# Patient Record
Sex: Male | Born: 1975 | Hispanic: No | Marital: Married | Smoking: Current every day smoker
Health system: Southern US, Community
[De-identification: ages and names within clinical notes are randomized; demographics above are authoritative.]

---

## 2017-01-29 ENCOUNTER — Emergency Department (HOSPITAL_COMMUNITY)
Admission: EM | Admit: 2017-01-29 | Discharge: 2017-01-29 | Disposition: A | Discharge status: Home / Self Care | Payer: Medicaid Other | Attending: Emergency Medicine | Admitting: Emergency Medicine

## 2017-01-29 ENCOUNTER — Encounter (HOSPITAL_COMMUNITY): Payer: Self-pay | Admitting: Emergency Medicine

## 2017-01-29 ENCOUNTER — Emergency Department (HOSPITAL_COMMUNITY): Payer: Medicaid Other

## 2017-01-29 DIAGNOSIS — F17200 Nicotine dependence, unspecified, uncomplicated: Secondary | ICD-10-CM | POA: Insufficient documentation

## 2017-01-29 DIAGNOSIS — S46911A Strain of unspecified muscle, fascia and tendon at shoulder and upper arm level, right arm, initial encounter: Secondary | ICD-10-CM | POA: Insufficient documentation

## 2017-01-29 DIAGNOSIS — Y939 Activity, unspecified: Secondary | ICD-10-CM | POA: Diagnosis not present

## 2017-01-29 DIAGNOSIS — X500XXA Overexertion from strenuous movement or load, initial encounter: Secondary | ICD-10-CM | POA: Diagnosis not present

## 2017-01-29 DIAGNOSIS — S4991XA Unspecified injury of right shoulder and upper arm, initial encounter: Secondary | ICD-10-CM | POA: Diagnosis present

## 2017-01-29 DIAGNOSIS — Y999 Unspecified external cause status: Secondary | ICD-10-CM | POA: Insufficient documentation

## 2017-01-29 DIAGNOSIS — Y929 Unspecified place or not applicable: Secondary | ICD-10-CM | POA: Diagnosis not present

## 2017-01-29 MED ORDER — IBUPROFEN 800 MG PO TABS: 800.0000 mg | ORAL_TABLET | Freq: Three times a day (TID) | ORAL | 0 refills | Status: DC

## 2017-01-29 MED ORDER — HYDROCODONE-ACETAMINOPHEN 5-325 MG PO TABS
2.0000 | ORAL_TABLET | Freq: Once | ORAL | Status: AC
  Administered 2017-01-29: 2 via ORAL
  Filled 2017-01-29: qty 2

## 2017-01-29 MED ORDER — CYCLOBENZAPRINE HCL 10 MG PO TABS: 10.0000 mg | ORAL_TABLET | Freq: Two times a day (BID) | ORAL | 0 refills | Status: DC | PRN

## 2017-01-29 NOTE — ED Provider Notes (Signed)
[facility]-EMERGENCY DEPT Provider Note   CSN: [id number] Arrival date & time: 01/29/17  1615     History   Chief Complaint Chief Complaint  Patient presents with  . Shoulder Pain    HPI Ronald Hawkins is a 41 y.o. male.  Patient presents to the emergency department with chief complaint of right shoulder pain. He states that he was lifting a couch yesterday, and felt a "tearing sensation" in his right shoulder. He denies any other injuries. He states that he is still able to lift his arm, but he cannot carry anything or lift anything heavy without severe pain. He states that the pain radiates from his neck to his shoulder and down his arm. 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 He has tried applying heat. His symptoms are worsened with movement and palpation.   The history is provided by the patient. The history is limited by a language barrier. A language interpreter was used.    History reviewed. No pertinent past medical history.  There are no active problems to display for this patient.   History reviewed. No pertinent surgical history.     Home Medications    Prior to Admission medications   Not on File    Family History No family history on file.  Social History Social History  Substance Use Topics  . Smoking status: Current Every Day Smoker  . Smokeless tobacco: Never Used  . Alcohol use No     Allergies   Patient has no known allergies.   Review of Systems Review of Systems  All other systems reviewed and are negative.    Physical Exam Updated Vital Signs BP 103/90 (BP Location: Left Arm)   Pulse 81   Temp 97.9 F (36.6 C) (Oral)   Resp 20   Ht 5\' 5"  (1.651 m)   Wt 73 kg   SpO2 99%   BMI 26.78 kg/m   Physical Exam Nursing note and vitals reviewed.  Constitutional: Pt appears well-developed and well-nourished. No distress.  HENT:  Head: Normocephalic and atraumatic.  Eyes: Conjunctivae are  normal.  Neck: Normal range of motion.  Cardiovascular: Normal rate, regular rhythm. Intact distal pulses.   Capillary refill < 3 sec.  Pulmonary/Chest: Effort normal and breath sounds normal.  Musculoskeletal:  Right Shoulder Pt exhibits tenderness to palpation of the right shoulder diffusely, no bony abnormality or deformity.   ROM: 4/5, limited by pain  Strength: 3/5 limited by pain  Neurological: Pt  is alert. Coordination normal.  Sensation: 5/5 Skin: Skin is warm and dry. Pt is not diaphoretic.  No evidence of open wound or skin tenting Psychiatric: Pt has a normal mood and affect.     ED Treatments / Results  Labs (all labs ordered are listed, but only abnormal results are displayed) Labs Reviewed - No data to display  EKG  EKG Interpretation None       Radiology No results found.  Procedures Procedures (including critical care time)  Medications Ordered in ED Medications  HYDROcodone-acetaminophen (NORCO/VICODIN) 5-325 MG per tablet 2 tablet (not administered)     Initial Impression / Assessment and Plan / ED Course  I have reviewed the triage vital signs and the nursing notes.  Pertinent labs & imaging results that were available during my care of the patient were reviewed by me and considered in my medical decision making (see chart for details).     Patient X-Ray negative for obvious fracture or  dislocation.  Suspect shoulder strain.  Pt advised to follow up with orthopedics. Patient given sling while in ED, conservative therapy recommended and discussed. Patient will be discharged home & is agreeable with above plan. Returns precautions discussed. Pt appears safe for discharge.   Final Clinical Impressions(s) / ED Diagnoses   Final diagnoses:  Strain of right shoulder, initial encounter    New Prescriptions New Prescriptions   CYCLOBENZAPRINE (FLEXERIL) 10 MG TABLET    Take 1 tablet (10 mg total) by mouth 2 (two) times daily as needed for muscle  spasms.   IBUPROFEN (ADVIL,MOTRIN) 800 MG TABLET    Take 1 tablet (800 mg total) by mouth 3 (three) times daily.     Roxy HorsemanRobert Tamikia Chowning, PA-C 01/29/17 1906    Bethann BerkshireJoseph Zammit, MD 01/29/17 2044

## 2017-01-29 NOTE — ED Triage Notes (Signed)
Pt c/o right shoulder pain onset yesterday after doing heavy lifting.

## 2017-01-29 NOTE — ED Notes (Signed)
Declined W/C at D/C and was escorted to lobby by RN. 

## 2017-01-29 NOTE — ED Triage Notes (Signed)
PT received instructions from PA on sling usage  [employer]ia [employer].

## 2018-07-17 ENCOUNTER — Encounter: Payer: Self-pay | Admitting: Physical Therapy

## 2018-07-17 ENCOUNTER — Other Ambulatory Visit: Payer: Self-pay

## 2018-07-17 ENCOUNTER — Ambulatory Visit: Payer: Medicaid Other | Attending: Physical Medicine and Rehabilitation | Admitting: Physical Therapy

## 2018-07-17 DIAGNOSIS — M5442 Lumbago with sciatica, left side: Secondary | ICD-10-CM | POA: Insufficient documentation

## 2018-07-17 DIAGNOSIS — R2689 Other abnormalities of gait and mobility: Secondary | ICD-10-CM | POA: Diagnosis present

## 2018-07-17 DIAGNOSIS — M6281 Muscle weakness (generalized): Secondary | ICD-10-CM | POA: Insufficient documentation

## 2018-07-17 DIAGNOSIS — R29818 Other symptoms and signs involving the nervous system: Secondary | ICD-10-CM | POA: Diagnosis present

## 2018-07-17 NOTE — Patient Instructions (Signed)
TENS UNIT  This is helpful for muscle pain and spasm.   Search and Purchase a TENS 7000 2nd edition at www.tenspros.com or www.amazon.com  (It should be less than $30)     TENS unit instructions:  Do not shower or bathe with the unit on Turn the unit off before removing electrodes or batteries If the electrodes lose stickiness add a drop of water to the electrodes after they are disconnected from the unit and place on plastic sheet. If you continued to have difficulty, call the TENS unit company to purchase more electrodes. Do not apply lotion on the skin area prior to use. Make sure the skin is clean and dry as this will help prolong the life of the electrodes. After use, always check skin for unusual red areas, rash or other skin difficulties. If there are any skin problems, does not apply electrodes to the same area. Never remove the electrodes from the unit by pulling the wires. Do not use the TENS unit or electrodes other than as directed. Do not change electrode placement without consulting your therapist or physician. Keep 2 fingers with between each electrode.   TENS stands for Transcutaneous Electrical Nerve Stimulation. In other words, electrical impulses are allowed to pass through the skin in order to excite a nerve.   Purpose and Use of TENS:  TENS is a method used to manage acute and chronic pain without the use of drugs. It has been effective in managing pain associated with surgery, sprains, strains, trauma, rheumatoid arthritis, and neuralgias. It is a non-addictive, low risk, and non-invasive technique used to control pain. It is not, by any means, a curative form of treatment.   How TENS Works:  Most TENS units are a small pocket-sized unit powered by one 9 volt battery. Attached to the outside of the unit are two lead wires where two pins and/or snaps connect on each wire. All units come with a set of four reusable pads or electrodes. These are placed on the skin  surrounding the area involved. By inserting the leads into  the pads, the electricity can pass from the unit making the circuit complete.  As the intensity is turned up slowly, the electrical current enters the body from the electrodes through the skin to the surrounding nerve fibers. This triggers the release of hormones from within the body. These hormones contain pain relievers. By increasing the circulation of these hormones, the person's pain may be lessened. It is also believed that the electrical stimulation itself helps to block the pain messages being sent to the brain, thus also decreasing the body's perception of pain.   Hazards:  TENS units are NOT to be used by patients with PACEMAKERS, DEFIBRILLATORS, DIABETIC PUMPS, PREGNANT WOMEN, and patients with SEIZURE DISORDERS.  TENS units are NOT to be used over the heart, throat, brain, or spinal cord.  One of the major side effects from the TENS unit may be skin irritation. Some people may develop a rash if they are sensitive to the materials used in the electrodes or the connecting wires.     Avoid overuse due the body getting used to the stem making it not as effective over time.    

## 2018-07-17 NOTE — Therapy (Addendum)
[facility] [address]  [address] [place], [place], [address] Phone: [phone]   Fax:  [phone]  Physical Therapy Evaluation  Patient Details  Name: Ronald Hawkins MRN: [id number] Date of Birth: Oct 15, 1976 Referring Provider: Romero Belling, MD   Encounter Date: 07/17/2018  PT End of Session - 07/17/18 1623    Visit Number  1    Number of Visits  4    Date for PT Re-Evaluation  08/07/18    Authorization Type  Medicaid    PT Start Time  1617    PT Stop Time  1718    PT Time Calculation (min)  61 min    Activity Tolerance  Patient tolerated treatment well;Patient limited by pain    Behavior During Therapy  [facility] for tasks assessed/performed       History reviewed. No pertinent past medical history.  History reviewed. No pertinent surgical history.  There were no vitals filed for this visit.   Subjective Assessment - 07/17/18 1628    Subjective  All information interpreted by pt's son: Pt states that he began having issues with his back in the middle of May with no specific MOI. 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 When trying to bend at work if he goes too far forward he is unable to stand up without first going all the way to the floor on his knees and standing up from there. Pt is currently out of work and hasn't worked in 3 months due to his back pain and does not have an anticipated date to return to work. Pt states that he has a lumbar belt that he normally wears during activity.     Patient is accompained by:  Family member;Interpreter Son serving as Database administrator;Other (comment) Work activities    Currently in Pain?  Yes    Pain Score  5     Pain Location  Back    Pain Orientation  Left    Pain Descriptors / Indicators  Sharp;Shooting;Tingling;Radiating    Pain Type  Acute pain    Pain Onset  More than a month ago    Pain Frequency   Constant    Aggravating Factors   Bending forward    Pain Relieving Factors  Medication, Rest    Effect of Pain on Daily Activities  Unable to perform work activities; pt is not working at this time         [facility] PT Assessment - 07/17/18 0001      Assessment   Medical Diagnosis  L4 Lumbar Redicular Pain    Referring Provider  Romero Belling, MD    Onset Date/Surgical Date  05/02/18    Next MD Visit  08/10/2018    Prior Therapy  No      Balance Screen   Has the patient fallen in the past 6 months  No    Has the patient had a decrease in activity level because of a fear of falling?   No    Is the patient reluctant to leave their home because of a fear of falling?   No      Prior Function   Level of Independence  Independent    Vocation  Full time employment    Garment/textile technologist - bending/lifting      Sensation   Light Touch  Impaired by gross assessment  Additional Comments  With sensation screen pt noted that less sensation felt in L LE in all dermatomal patterns except L3 and L4 with those patterns feeling symmetrical on both sides. Pt states that he experiences a pins and needles feeling in the back of the L leg all the way down into the heel and in the back of the R leg to the knee.       Posture/Postural Control   Posture/Postural Control  Postural limitations    Postural Limitations  Rounded Shoulders;Decreased lumbar lordosis;Posterior pelvic tilt;Flexed trunk      ROM / Strength   AROM / PROM / Strength  Strength;AROM      AROM   AROM Assessment Site  Lumbar    Lumbar Flexion  50% in pain free range    Lumbar Extension  WNL    Lumbar - Right Rotation  WNL    Lumbar - Left Rotation  WNL with pain provocation down the R LE      Strength   Strength Assessment Site  Hip;Knee;Ankle    Right/Left Hip  Right;Left    Right Hip Flexion  4-/5    Right Hip Extension  3+/5    Right Hip ABduction  4-/5    Right Hip ADduction  3-/5    Left Hip Flexion  3+/5     Left Hip Extension  3/5    Left Hip ABduction  3+/5    Left Hip ADduction  3-/5    Right/Left Knee  Right;Left    Right Knee Flexion  4-/5    Right Knee Extension  4+/5    Left Knee Flexion  3+/5    Left Knee Extension  4-/5    Right/Left Ankle  Right;Left    Right Ankle Dorsiflexion  5/5    Left Ankle Dorsiflexion  3/5      Flexibility   Soft Tissue Assessment /Muscle Length  yes    Hamstrings  Mod Hamstring tightness in bil LEs       Special Tests    Special Tests  Lumbar    Lumbar Tests  Slump Test;Straight Leg Raise      Slump test   Findings  Positive    Side  Left      Straight Leg Raise   Findings  Positive    Side   Right    Comment  Positive both R and L                Objective measurements completed on examination: See above findings.      [facility] Adult PT Treatment/Exercise - 07/17/18 0001      Exercises   Exercises  Lumbar      Lumbar Exercises: Stretches   Prone on Elbows Stretch  Other (comment)    Prone on Elbows Stretch Limitations  10 repetitions raising up onto elbows      Modalities   Modalities  Electrical Stimulation;Cryotherapy      Cryotherapy   Number Minutes Cryotherapy  15 Minutes    Cryotherapy Location  Lumbar Spine    Type of Cryotherapy  Ice pack      Electrical Stimulation   Electrical Stimulation Location  15    Electrical Stimulation Action  TENS    Electrical Stimulation Parameters  SD1; intensity to patient tolerance    Electrical Stimulation Goals  Pain             PT Education - 07/17/18 1744    Education Details  Pt  educated on results of examination, POC, HEP, and TENs unit    Person(s) Educated  Patient;Child(ren)    Methods  Explanation;Demonstration;Handout    Comprehension  Verbalized understanding       PT Short Term Goals - 07/17/18 1748      PT SHORT TERM GOAL #1   Title  Pt will be independent with initial HEP    Status  New    Target Date  07/24/18      PT SHORT TERM GOAL #2    Title  Pt will demonstrate global LE strength of 4-/5 to improve ability to tolerate functional and work-related activities    Status  New    Target Date  08/07/18      PT SHORT TERM GOAL #3   Title  Pt will demonstrate 75% of available lumbar ROM without increased pain to improve ability to perform work-related activities and ADLs    Status  New    Target Date  08/07/18      PT SHORT TERM GOAL #4   Title  Pt will report 50% improvement in symptoms from baseline with improvement in ability to improve tolerance to functional activities    Status  New    Target Date  08/07/18        PT Long Term Goals - 07/17/18 1753      PT LONG TERM GOAL #1   Title  Pt will be independent with advanced HEP    Status  New    Target Date  09/18/18      PT LONG TERM GOAL #2   Title  Pt will demonstrate 5/5 LE strength to improve ability to perform work-related activities and ADLs     Status  New    Target Date  09/18/18             Plan - 07/17/18 1745    Clinical Impression Statement  Pt is a 42 y/o M who presents to OP PT with c/c of LBP secondary to 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 Examination reveals decreased lumbar AROM with pain provocation, + SLR test bilaterally, and decreased global hip and knee strength. These impairments are limiting pt's ability to perform work related activities without pain including bending forward to pick up tools. His prognosis is positively impacted by the natural history of the dx, and motivation to return to work. Assessment somewhat limited today secondary to time requirements of interpretation, and formal interpreter will be present for future visits.      Clinical Presentation  Stable    Clinical Decision Making  Low    Rehab Potential  Good    PT Frequency  1x / week    PT Duration  3 weeks    PT Treatment/Interventions  ADLs/Self Care Home Management;Cryotherapy;Iontophoresis 4mg /ml Dexamethasone;Moist Heat;Ultrasound;Gait  training;Stair training;Electrical Stimulation;Functional mobility training;Therapeutic activities;Therapeutic exercise;Balance training;Neuromuscular re-education;Patient/family education;Manual techniques;Passive range of motion;Dry needling;Taping    PT Next Visit Plan  Obtain more activity limitations and participation restrictions    Consulted and Agree with Plan of Care  Patient;Family member/caregiver    Family Member Consulted  Son serving as interpreter       Patient will benefit from skilled therapeutic intervention in order to improve the following deficits and impairments:  Abnormal gait, Hypomobility, Impaired sensation, Decreased activity tolerance, Decreased strength, Pain, Decreased balance, Difficulty walking, Increased muscle spasms, Decreased range of motion, Improper body mechanics, Impaired flexibility, Postural dysfunction  Visit Diagnosis: Acute bilateral low back  pain with left-sided sciatica  Muscle weakness (generalized)  Other symptoms and signs involving the nervous system  Other abnormalities of gait and mobility     Problem List There are no active problems to display for this patient.   Mikal Plane, SPT 07/17/2018, 6:33 PM  [facility] [address]  [address] [place], [place], [address] Phone: [phone]   Fax:  [phone]  Name: Lawton Dollinger MRN: [id number] Date of Birth: 12-Nov-1976

## 2018-07-25 ENCOUNTER — Ambulatory Visit: Payer: Medicaid Other | Attending: Physical Medicine and Rehabilitation

## 2018-07-25 DIAGNOSIS — M6281 Muscle weakness (generalized): Secondary | ICD-10-CM | POA: Insufficient documentation

## 2018-07-25 DIAGNOSIS — R29818 Other symptoms and signs involving the nervous system: Secondary | ICD-10-CM | POA: Insufficient documentation

## 2018-07-25 DIAGNOSIS — R2689 Other abnormalities of gait and mobility: Secondary | ICD-10-CM | POA: Insufficient documentation

## 2018-07-25 DIAGNOSIS — M5442 Lumbago with sciatica, left side: Secondary | ICD-10-CM | POA: Insufficient documentation

## 2018-08-01 ENCOUNTER — Ambulatory Visit: Payer: Medicaid Other

## 2018-08-08 ENCOUNTER — Ambulatory Visit: Payer: Medicaid Other

## 2018-08-08 DIAGNOSIS — M6281 Muscle weakness (generalized): Secondary | ICD-10-CM

## 2018-08-08 DIAGNOSIS — R2689 Other abnormalities of gait and mobility: Secondary | ICD-10-CM | POA: Diagnosis present

## 2018-08-08 DIAGNOSIS — R29818 Other symptoms and signs involving the nervous system: Secondary | ICD-10-CM

## 2018-08-08 DIAGNOSIS — M5442 Lumbago with sciatica, left side: Secondary | ICD-10-CM | POA: Diagnosis present

## 2018-08-08 NOTE — Therapy (Signed)
[facility] [address]  [address] [place], [place], [address] Phone: [phone]   Fax:  [phone]  Physical Therapy Treatment  Patient Details  Name: Lauree ChandlerHusam Marrazzo MRN: [id number] Date of Birth: 08-22-1976 Referring Provider: Romero BellingWesley Ibazebo, MD   Encounter Date: 08/08/2018  PT End of Session - 08/08/18 0811    Visit Number  2    Number of Visits  4    Date for PT Re-Evaluation  --    Authorization Type  Medicaid    Authorization Time Period  8.7.19 - 8.27.19    Authorization - Visit Number  1    Authorization - Number of Visits  3    PT Start Time  0804    PT Stop Time  0900    PT Time Calculation (min)  56 min    Activity Tolerance  Patient tolerated treatment well;Patient limited by pain    Behavior During Therapy  [facility] for tasks assessed/performed       No past medical history on file.  No past surgical history on file.  There were no vitals filed for this visit.  Subjective Assessment - 08/08/18 0812    Subjective  Pt. noting he is still not working and missed last two appointments due to being unable to move from leg pain and having an MRI scheduled.      Patient is accompained by:  Family member;Interpreter   son and interpreter   Currently in Pain?  Yes    Pain Score  6     Pain Location  Back    Pain Orientation  Left    Pain Descriptors / Indicators  Sharp;Shooting   "electric"   Pain Type  Acute pain    Pain Onset  More than a month ago    Pain Frequency  Constant    Aggravating Factors   Bending forward, sleeping on both sides     Pain Relieving Factors  medication, rest, stairs    Multiple Pain Sites  No                       [facility] Adult PT Treatment/Exercise - 08/08/18 0822      Self-Care   Self-Care  Other Self-Care Comments    Other Self-Care Comments   Discussed HEP with heavy use of interpreter to ensure pt. understanding of HEP and increased time required as pt. with  tendancy to converse through instruction and difficulty understanding       Lumbar Exercises: Stretches   Passive Hamstring Stretch  Right;Left;2 reps;30 seconds    Passive Hamstring Stretch Limitations  with strap       Lumbar Exercises: Aerobic   Nustep  Lvl 4, 6 min       Lumbar Exercises: Supine   Bent Knee Raise  5 reps;3 seconds    Bent Knee Raise Limitations  Terminated due to increased LBP    Bridge  10 reps;3 seconds    Bridge Limitations  limited range due to pain increase in upper range     Other Supine Lumbar Exercises  Hooklying adduction ball squeeze 5" x 10 reps       Lumbar Exercises: Prone   Other Prone Lumbar Exercises  POE 2 x 30 sec      Cryotherapy   Number Minutes Cryotherapy  15 Minutes    Cryotherapy Location  Lumbar Spine    Type of Cryotherapy  Ice pack  Glass blower/designerlectrical Stimulation   Electrical Stimulation Location  15    Electrical Stimulation Action  IFC    Electrical Stimulation Parameters  intensity to pt. tolerance, 15'    Electrical Stimulation Goals  Pain             PT Education - 08/08/18 [phone]    Education Details  HEP update: instructions in english as google translate not accurate with translation thus son will interpret instructions     Person(s) Educated  Patient    Methods  Explanation;Demonstration;Verbal cues;Handout    Comprehension  Verbalized understanding;Returned demonstration;Verbal cues required;Need further instruction       PT Short Term Goals - 08/08/18 0816      PT SHORT TERM GOAL #1   Title  Pt will be independent with initial HEP    Status  On-going      PT SHORT TERM GOAL #2   Title  Pt will demonstrate global LE strength of 4-/5 to improve ability to tolerate functional and work-related activities    Status  On-going      PT SHORT TERM GOAL #3   Title  Pt will demonstrate 75% of available lumbar ROM without increased pain to improve ability to perform work-related activities and ADLs    Status  On-going       PT SHORT TERM GOAL #4   Title  Pt will report 50% improvement in symptoms from baseline with improvement in ability to improve tolerance to work-related activities    Status  On-going        PT Long Term Goals - 08/08/18 0816      PT LONG TERM GOAL #1   Title  Pt will be independent with advanced HEP    Status  On-going      PT LONG TERM GOAL #2   Title  Pt will demonstrate 5/5 LE strength to improve ability to perform work-related activities and ADLs     Status  On-going            Plan - 08/08/18 [id number]    Clinical Impression Statement  Pt. noting, via interpreter, he purchased TENS unit on [employer] with some pain relief.  Reports he missed previous therapy appointments due to being, "unable to move due to L LE pain".  Pt. demonstrating increased LBP with all hooklying positioning today and decreased pain with prone positioning.  HEP updated with prone activities today.  Increased time required today to ensure pt. understanding with use of interpreter as pt. with tendency to talk throughout session.  Pt. issued HEP and will plan to monitor response at upcoming visit.  Ended visit with E-stim/ice pack to lumbar spine in prone positioning with good relief following this.     PT Treatment/Interventions  ADLs/Self Care Home Management;Cryotherapy;Iontophoresis 4mg /ml Dexamethasone;Moist Heat;Ultrasound;Gait training;Stair training;Electrical Stimulation;Functional mobility training;Therapeutic activities;Therapeutic exercise;Balance training;Neuromuscular re-education;Patient/family education;Manual techniques;Passive range of motion;Dry needling;Taping    Consulted and Agree with Plan of Care  Patient;Family member/caregiver    Family Member Consulted  son       Patient will benefit from skilled therapeutic intervention in order to improve the following deficits and impairments:  Abnormal gait, Hypomobility, Impaired sensation, Decreased activity tolerance, Decreased strength, Pain,  Decreased balance, Difficulty walking, Increased muscle spasms, Decreased range of motion, Improper body mechanics, Impaired flexibility, Postural dysfunction  Visit Diagnosis: Acute bilateral low back pain with left-sided sciatica  Muscle weakness (generalized)  Other symptoms and signs involving the nervous system  Other abnormalities of gait and mobility  Problem List There are no active problems to display for this patient.   Kermit Balo, PTA 08/08/18 12:25 PM  [facility] [address]  [address] [place], [place], [address] Phone: [phone]   Fax:  [phone]  Name: Zyden Suman MRN: [id number] Date of Birth: 07/11/1976

## 2018-08-14 ENCOUNTER — Encounter: Payer: Self-pay | Admitting: Physical Therapy

## 2018-08-14 ENCOUNTER — Ambulatory Visit: Payer: Medicaid Other | Admitting: Physical Therapy

## 2018-08-14 DIAGNOSIS — M6281 Muscle weakness (generalized): Secondary | ICD-10-CM

## 2018-08-14 DIAGNOSIS — R2689 Other abnormalities of gait and mobility: Secondary | ICD-10-CM

## 2018-08-14 DIAGNOSIS — M5442 Lumbago with sciatica, left side: Secondary | ICD-10-CM | POA: Diagnosis not present

## 2018-08-14 DIAGNOSIS — R29818 Other symptoms and signs involving the nervous system: Secondary | ICD-10-CM

## 2018-08-14 NOTE — Patient Instructions (Signed)

## 2018-08-14 NOTE — Therapy (Addendum)
[facility] [address]  [place], [place], [address] Phone: [phone]   Fax:  [phone]  Physical Therapy Treatment  Patient Details  Name: Ronald Hawkins MRN: [id number] Date of Birth: 03-06-1976 Referring Provider: Laroy Apple, MD   Encounter Date: 08/14/2018  PT End of Session - 08/14/18 0846    Visit Number  3    Number of Visits  4    Authorization Type  Medicaid    Authorization Time Period  07/25/18 - 08/14/18    Authorization - Visit Number  2    Authorization - Number of Visits  3    PT Start Time  0846    PT Stop Time  0934    PT Time Calculation (min)  48 min    Activity Tolerance  Patient tolerated treatment well;Patient limited by pain    Behavior During Therapy  [facility] for tasks assessed/performed       History reviewed. No pertinent past medical history.  History reviewed. No pertinent surgical history.  There were no vitals filed for this visit.  Subjective Assessment - 08/14/18 0849    Subjective  Pt reports when he takes the medication at night, in the morning he feels heaviness and has difficulty walking.    Patient is accompained by:  Interpreter    Limitations  Sitting;Standing;Lifting    How long can you sit comfortably?  1-2 hrs    How long can you stand comfortably?  10-15 min    How long can you walk comfortably?  15-20 min    Currently in Pain?  Yes    Pain Score  6     Pain Location  Back    Pain Orientation  Lower    Pain Descriptors / Indicators  Heaviness;Tightness    Pain Type  Acute pain    Pain Radiating Towards  intermittent numbness in B LE down to feet    Pain Onset  More than a month ago    Pain Frequency  Constant    Aggravating Factors   sitting, bending forward    Pain Relieving Factors  medicatiom, tramadol    Effect of Pain on Daily Activities  remains out of work due to pain         [facility] PT Assessment - 08/14/18 0846      Assessment   Medical  Diagnosis  L4 Lumbar Radicular Pain    Referring Provider  Laroy Apple, MD    Next MD Visit  08/24/18      AROM   Lumbar Flexion  50% in pain free range; hands to ankles with pain    Lumbar Extension  WNL    Lumbar - Right Rotation  WNL    Lumbar - Left Rotation  WNL with pain provocation down the R LE      Strength   Right Hip Flexion  4+/5    Right Hip Extension  4/5    Right Hip ABduction  4/5    Right Hip ADduction  4-/5    Left Hip Flexion  4/5    Left Hip Extension  3+/5    Left Hip ABduction  4-/5    Left Hip ADduction  3+/5    Right Knee Flexion  4+/5    Right Knee Extension  4+/5    Left Knee Flexion  4/5    Left Knee Extension  4-/5    Right Ankle Dorsiflexion  4+/5    Left  Ankle Dorsiflexion  4-/5                   [facility] Adult PT Treatment/Exercise - 08/14/18 0846      Exercises   Exercises  Lumbar      Lumbar Exercises: Stretches   Prone on Elbows Stretch  60 seconds;3 reps    Press Ups  5 reps;5 seconds   2 sets   Quadruped Mid Back Stretch  30 seconds    Quadruped Mid Back Stretch Limitations  seated prayer stretch with green Pball (unable to tolerate quadruped posiiton) - no increased pain during stretch, but reports increased pain upon return to upright sitting      Lumbar Exercises: Aerobic   Recumbent Bike  L2 x 6 min      Lumbar Exercises: Standing   Functional Squats  10 reps;3 seconds    Functional Squats Limitations  repeated verbal & tactile cues to acheive proper technique - performed best when chair placed as target for hips behind pt     Row  Both;10 reps;Theraband;Strengthening    Theraband Level (Row)  Level 2 (Red)    Row Limitations  cues for abdominal bracing & scapular activation, avoiding excessive shoulder rotation    Shoulder Extension  Both;10 reps;Theraband;Strengthening    Theraband Level (Shoulder Extension)  Level 2 (Red)    Shoulder Extension Limitations  cues for abdominal bracing & scapular activation       Lumbar Exercises: Quadruped   Other Quadruped Lumbar Exercises  pt unable to tolerate quadruped position             PT Education - 08/14/18 0943    Education Details  Provided education in proper posture and body mechanics for positioning and common daily tasks at home    Person(s) Educated  Patient    Methods  Explanation;Demonstration    Comprehension  Verbalized understanding       PT Short Term Goals - 08/14/18 0925      PT SHORT TERM GOAL #1   Title  Pt will be independent with initial HEP    Status  Achieved      PT SHORT TERM GOAL #2   Title  Pt will demonstrate global LE strength of 4-/5 to improve ability to tolerate functional and work-related activities    Status  Partially Met      PT SHORT TERM GOAL #3   Title  Pt will demonstrate 75% of available lumbar ROM without increased pain to improve ability to perform work-related activities and ADLs    Status  Not Met      PT SHORT TERM GOAL #4   Title  Pt will report 50% improvement in symptoms from baseline with improvement in ability to improve tolerance to work-related activities    Status  Not Met        PT Long Term Goals - 08/08/18 0816      PT LONG TERM GOAL #1   Title  Pt will be independent with advanced HEP    Status  On-going      PT LONG TERM GOAL #2   Title  Pt will demonstrate 5/5 LE strength to improve ability to perform work-related activities and ADLs     Status  On-going            Plan - 08/14/18 0954    Clinical Impression Statement  Ronald Hawkins continues to report significant LBP with LE radiculopathy limiting positional and activity tolerance with most activities.  He missed his first 2 scheduled f/u visits following the eval due to being "unable to move due to L LE pain" and although notes some relief of pain with prone positioning and certain exercises, he does not feel he has had a significant change in his pain with the 2 treatment visits he has completed over the past 2 weeks.  Lumbar ROM essentially unchanged, but slight improvement noted with LE strength testing. Pt's initial Medicaid authorization expiring today and pt would prefer to wait and follow up with MD on 08/24/18 rather than have PT submit for further Medicaid visits, therefore will place pt on hold for 30 days in the event that the pt or the MD would like him to continue with PT.    PT Treatment/Interventions  ADLs/Self Care Home Management;Cryotherapy;Iontophoresis '4mg'$ /ml Dexamethasone;Moist Heat;Ultrasound;Gait training;Stair training;Electrical Stimulation;Functional mobility training;Therapeutic activities;Therapeutic exercise;Balance training;Neuromuscular re-education;Patient/family education;Manual techniques;Passive range of motion;Dry needling;Taping    PT Next Visit Plan  30 day hold    Consulted and Agree with Plan of Care  Patient       Patient will benefit from skilled therapeutic intervention in order to improve the following deficits and impairments:  Abnormal gait, Hypomobility, Impaired sensation, Decreased activity tolerance, Decreased strength, Pain, Decreased balance, Difficulty walking, Increased muscle spasms, Decreased range of motion, Improper body mechanics, Impaired flexibility, Postural dysfunction  Visit Diagnosis: Acute bilateral low back pain with left-sided sciatica  Muscle weakness (generalized)  Other symptoms and signs involving the nervous system  Other abnormalities of gait and mobility     Problem List There are no active problems to display for this patient.   Percival Spanish, PT, MPT 08/14/2018, 10:07 AM  [facility] [address]  [place], [place], [address] Phone: [phone]   Fax:  [phone]  Name: Ronald Hawkins MRN: [id number] Date of Birth: 1976-07-01  PHYSICAL THERAPY DISCHARGE SUMMARY  Visits from Start of Care: 3  Current functional level related to goals / functional outcomes:    Refer to above clinical impression for status as of last visit on 08/14/18. Pt was placed on hold for 30 days and did not return to PT, therefore will proceed with discharge from PT for this episode.   Remaining deficits:   As above.   Education / Equipment:   HEP, Training and development officer education Plan: Patient agrees to discharge.  Patient goals were not met. Patient is being discharged due to not returning since the last visit.  ?????   Percival Spanish, PT, MPT 10/18/18, 9:14 AM  [facility] [place], [place], [address] Phone: [phone]   Fax:  [phone]

## 2019-06-01 ENCOUNTER — Encounter (HOSPITAL_COMMUNITY): Payer: Self-pay | Admitting: Emergency Medicine

## 2019-06-01 ENCOUNTER — Emergency Department (HOSPITAL_COMMUNITY): Payer: Medicaid Other

## 2019-06-01 ENCOUNTER — Other Ambulatory Visit: Payer: Self-pay

## 2019-06-01 ENCOUNTER — Emergency Department (HOSPITAL_COMMUNITY)
Admission: EM | Admit: 2019-06-01 | Discharge: 2019-06-01 | Disposition: A | Discharge status: Home / Self Care | Payer: Medicaid Other | Attending: Emergency Medicine | Admitting: Emergency Medicine

## 2019-06-01 DIAGNOSIS — Y9389 Activity, other specified: Secondary | ICD-10-CM | POA: Diagnosis not present

## 2019-06-01 DIAGNOSIS — S0990XA Unspecified injury of head, initial encounter: Secondary | ICD-10-CM

## 2019-06-01 DIAGNOSIS — Y9259 Other trade areas as the place of occurrence of the external cause: Secondary | ICD-10-CM | POA: Diagnosis not present

## 2019-06-01 DIAGNOSIS — S0083XA Contusion of other part of head, initial encounter: Secondary | ICD-10-CM | POA: Diagnosis not present

## 2019-06-01 DIAGNOSIS — S6991XA Unspecified injury of right wrist, hand and finger(s), initial encounter: Secondary | ICD-10-CM | POA: Insufficient documentation

## 2019-06-01 DIAGNOSIS — S339XXA Sprain of unspecified parts of lumbar spine and pelvis, initial encounter: Secondary | ICD-10-CM | POA: Insufficient documentation

## 2019-06-01 DIAGNOSIS — Y998 Other external cause status: Secondary | ICD-10-CM | POA: Insufficient documentation

## 2019-06-01 DIAGNOSIS — F17210 Nicotine dependence, cigarettes, uncomplicated: Secondary | ICD-10-CM | POA: Diagnosis not present

## 2019-06-01 DIAGNOSIS — S335XXA Sprain of ligaments of lumbar spine, initial encounter: Secondary | ICD-10-CM

## 2019-06-01 MED ORDER — ACETAMINOPHEN 500 MG PO TABS
1000.0000 mg | ORAL_TABLET | Freq: Once | ORAL | Status: AC
  Administered 2019-06-01: 1000 mg via ORAL
  Filled 2019-06-01: qty 2

## 2019-06-01 MED ORDER — IBUPROFEN 600 MG PO TABS: 600.0000 mg | ORAL_TABLET | Freq: Four times a day (QID) | ORAL | 0 refills | Status: DC | PRN

## 2019-06-01 NOTE — Discharge Instructions (Signed)
Take motrin for pain   Expect to be stiff and sore and you will be black and blue   See your doctor  Return to ER if you have worse headaches, loss of consciousness, vomiting
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
## 2019-09-26 ENCOUNTER — Other Ambulatory Visit: Payer: Self-pay | Admitting: Neurosurgery

## 2019-09-26 DIAGNOSIS — M5416 Radiculopathy, lumbar region: Secondary | ICD-10-CM

## 2019-10-13 ENCOUNTER — Other Ambulatory Visit: Payer: Self-pay

## 2019-10-13 ENCOUNTER — Ambulatory Visit
Admission: RE | Admit: 2019-10-13 | Discharge: 2019-10-13 | Disposition: A | Discharge status: Home / Self Care | Payer: Medicaid Other | Source: Ambulatory Visit | Attending: Neurosurgery | Admitting: Neurosurgery

## 2019-10-13 DIAGNOSIS — M5416 Radiculopathy, lumbar region: Secondary | ICD-10-CM

## 2019-10-24 ENCOUNTER — Other Ambulatory Visit: Payer: Self-pay

## 2019-10-24 ENCOUNTER — Encounter (HOSPITAL_BASED_OUTPATIENT_CLINIC_OR_DEPARTMENT_OTHER): Payer: Self-pay | Admitting: Emergency Medicine

## 2019-10-24 ENCOUNTER — Emergency Department (HOSPITAL_BASED_OUTPATIENT_CLINIC_OR_DEPARTMENT_OTHER)
Admission: EM | Admit: 2019-10-24 | Discharge: 2019-10-24 | Disposition: A | Discharge status: Home / Self Care | Payer: Medicaid Other | Attending: Emergency Medicine | Admitting: Emergency Medicine

## 2019-10-24 DIAGNOSIS — R509 Fever, unspecified: Secondary | ICD-10-CM | POA: Insufficient documentation

## 2019-10-24 DIAGNOSIS — F17210 Nicotine dependence, cigarettes, uncomplicated: Secondary | ICD-10-CM | POA: Diagnosis not present

## 2019-10-24 DIAGNOSIS — R6883 Chills (without fever): Secondary | ICD-10-CM | POA: Diagnosis present

## 2019-10-24 DIAGNOSIS — Z20828 Contact with and (suspected) exposure to other viral communicable diseases: Secondary | ICD-10-CM | POA: Insufficient documentation

## 2019-10-24 DIAGNOSIS — Z20822 Contact with and (suspected) exposure to covid-19: Secondary | ICD-10-CM

## 2019-10-24 LAB — CBC WITH DIFFERENTIAL/PLATELET
Abs Immature Granulocytes: 0.08 10*3/uL — ABNORMAL HIGH (ref 0.00–0.07)
Basophils Absolute: 0.1 10*3/uL (ref 0.0–0.1)
Basophils Relative: 1 %
Eosinophils Absolute: 0.5 10*3/uL (ref 0.0–0.5)
Eosinophils Relative: 3 %
HCT: 46.3 % (ref 39.0–52.0)
Hemoglobin: 15.7 g/dL (ref 13.0–17.0)
Immature Granulocytes: 1 %
Lymphocytes Relative: 21 %
Lymphs Abs: 3.2 10*3/uL (ref 0.7–4.0)
MCH: 30 pg (ref 26.0–34.0)
MCHC: 33.9 g/dL (ref 30.0–36.0)
MCV: 88.5 fL (ref 80.0–100.0)
Monocytes Absolute: 1 10*3/uL (ref 0.1–1.0)
Monocytes Relative: 7 %
Neutro Abs: 10.4 10*3/uL — ABNORMAL HIGH (ref 1.7–7.7)
Neutrophils Relative %: 67 %
Platelets: 256 10*3/uL (ref 150–400)
RBC: 5.23 MIL/uL (ref 4.22–5.81)
RDW: 11.6 % (ref 11.5–15.5)
WBC: 15.3 10*3/uL — ABNORMAL HIGH (ref 4.0–10.5)
nRBC: 0 % (ref 0.0–0.2)

## 2019-10-24 LAB — URINALYSIS, ROUTINE W REFLEX MICROSCOPIC
Bilirubin Urine: NEGATIVE
Glucose, UA: NEGATIVE mg/dL
Hgb urine dipstick: NEGATIVE
Ketones, ur: NEGATIVE mg/dL
Leukocytes,Ua: NEGATIVE
Nitrite: NEGATIVE
Protein, ur: NEGATIVE mg/dL
Specific Gravity, Urine: 1.01 (ref 1.005–1.030)
pH: 6 (ref 5.0–8.0)

## 2019-10-24 LAB — BASIC METABOLIC PANEL
Anion gap: 10 (ref 5–15)
BUN: 9 mg/dL (ref 6–20)
CO2: 20 mmol/L — ABNORMAL LOW (ref 22–32)
Calcium: 9.1 mg/dL (ref 8.9–10.3)
Chloride: 103 mmol/L (ref 98–111)
Creatinine, Ser: 0.76 mg/dL (ref 0.61–1.24)
GFR calc Af Amer: >60 mL/min (ref >60)
GFR calc non Af Amer: >60 mL/min (ref >60)
Glucose, Bld: 166 mg/dL — ABNORMAL HIGH (ref 70–99)
Potassium: 3.4 mmol/L — ABNORMAL LOW (ref 3.5–5.1)
Sodium: 133 mmol/L — ABNORMAL LOW (ref 135–145)

## 2019-10-24 LAB — CK: Total CK: 86 U/L (ref 49–397)

## 2019-10-24 LAB — SARS CORONAVIRUS 2 (TAT 6-24 HRS): SARS Coronavirus 2: NEGATIVE

## 2019-10-24 MED ORDER — KETOROLAC TROMETHAMINE 15 MG/ML IJ SOLN
15.0000 mg | Freq: Once | INTRAMUSCULAR | Status: AC
  Administered 2019-10-24: 15 mg via INTRAVENOUS
  Filled 2019-10-24: qty 1

## 2019-10-24 MED ORDER — NAPROXEN 500 MG PO TABS: ORAL_TABLET | ORAL | 0 refills | Status: AC

## 2019-10-24 MED ORDER — SODIUM CHLORIDE 0.9 % IV BOLUS
1000.0000 mL | Freq: Once | INTRAVENOUS | Status: AC
  Administered 2019-10-24: 02:00:00 1000 mL via INTRAVENOUS

## 2019-10-24 NOTE — ED Triage Notes (Addendum)
Pt co body aches with chills and subjective fever since yesterday. Denies cough, chest pain, vomiting, sore throat.

## 2019-10-24 NOTE — ED Provider Notes (Signed)
[place] [facility] Provider Note: Georgena Spurling, MD, FACEP  CSN: [id number] MRN: [id number] ARRIVAL: 10/24/19 at [place]: [place] PRESENT ILLNESS  10/24/19 1:51 AM Leelan Rizzi is a 43 y.o. male with body aches, chills and subjective fever since yesterday.  He has had some mild nasal congestion but denies cough, shortness of breath, chest pain, nausea, vomiting, diarrhea or sore throat.  He has had no known COVID-19 exposure.  He rates his body aches as an 8 out of 10.  These are present in his muscles as well as joints, worse with movement or palpation.  He has taken an over-the-counter analgesic with partial relief.   History reviewed. No pertinent past medical history.  History reviewed. No pertinent surgical history.  No family history on file.  Social History   Tobacco Use  . Smoking status: Current Every Day Smoker  . Smokeless tobacco: Never Used  Substance Use Topics  . Alcohol use: No  . Drug use: Never    Prior to Admission medications   Medication Sig Start Date End Date Taking? Authorizing Provider  naproxen (NAPROSYN) 500 MG tablet Take 1 tablet twice daily as needed for fever or body aches. 10/24/19   Eladio Dentremont, Jenny Reichmann, MD    Allergies Patient has no known allergies.   REVIEW OF SYSTEMS  Negative except as noted here or in the History of Present Illness.   PHYSICAL EXAMINATION  Initial Vital Signs Blood pressure (!) 141/83, pulse (!) 112, temperature 98.3 F (36.8 C), temperature source Oral, resp. rate 16, SpO2 96 %.  Examination General: Well-developed, well-nourished male in no acute distress; appearance consistent with age of record HENT: normocephalic; atraumatic; no pharyngeal erythema or exudate Eyes: pupils equal, round and reactive to light; extraocular muscles intact Neck: supple Heart: regular rate and rhythm; tachycardia Lungs: clear to auscultation bilaterally Abdomen: soft; nondistended;  nontender; bowel sounds present Extremities: No deformity; generalized muscle tenderness; pulses normal Neurologic: Awake, alert and oriented; motor function intact in all extremities and symmetric; no facial droop Skin: Warm and dry Psychiatric: Normal mood and affect   RESULTS  Summary of this visit's results, reviewed and interpreted by myself:   EKG Interpretation  Date/Time:    Ventricular Rate:    PR Interval:    QRS Duration:   QT Interval:    QTC Calculation:   R Axis:     Text Interpretation:        Laboratory Studies: Results for orders placed or performed during the hospital encounter of 10/24/19 (from the past 24 hour(s))  CBC with Differential     Status: Abnormal   Collection Time: 10/24/19  2:06 AM  Result Value Ref Range   WBC 15.3 (H) 4.0 - 10.5 K/uL   RBC 5.23 4.22 - 5.81 MIL/uL   Hemoglobin 15.7 13.0 - 17.0 g/dL   HCT 46.3 39.0 - 52.0 %   MCV 88.5 80.0 - 100.0 fL   MCH 30.0 26.0 - 34.0 pg   MCHC 33.9 30.0 - 36.0 g/dL   RDW 11.6 11.5 - 15.5 %   Platelets 256 150 - 400 K/uL   nRBC 0.0 0.0 - 0.2 %   Neutrophils Relative % 67 %   Neutro Abs 10.4 (H) 1.7 - 7.7 K/uL   Lymphocytes Relative 21 %   Lymphs Abs 3.2 0.7 - 4.0 K/uL   Monocytes Relative 7 %   Monocytes Absolute 1.0 0.1 - 1.0 K/uL   Eosinophils  Relative 3 %   Eosinophils Absolute 0.5 0.0 - 0.5 K/uL   Basophils Relative 1 %   Basophils Absolute 0.1 0.0 - 0.1 K/uL   Immature Granulocytes 1 %   Abs Immature Granulocytes 0.08 (H) 0.00 - 0.07 K/uL  Basic metabolic panel     Status: Abnormal   Collection Time: 10/24/19  2:06 AM  Result Value Ref Range   Sodium 133 (L) 135 - 145 mmol/L   Potassium 3.4 (L) 3.5 - 5.1 mmol/L   Chloride 103 98 - 111 mmol/L   CO2 20 (L) 22 - 32 mmol/L   Glucose, Bld 166 (H) 70 - 99 mg/dL   BUN 9 6 - 20 mg/dL   Creatinine, Ser 0.98 0.61 - 1.24 mg/dL   Calcium 9.1 8.9 - 11.9 mg/dL   GFR calc non Af Amer >60 >60 mL/min   GFR calc Af Amer >60 >60 mL/min   Anion gap  10 5 - 15  CK     Status: None   Collection Time: 10/24/19  2:06 AM  Result Value Ref Range   Total CK 86 49 - 397 U/L  Urinalysis, Routine w reflex microscopic     Status: None   Collection Time: 10/24/19  3:30 AM  Result Value Ref Range   Color, Urine YELLOW YELLOW   APPearance CLEAR CLEAR   Specific Gravity, Urine 1.010 1.005 - 1.030   pH 6.0 5.0 - 8.0   Glucose, UA NEGATIVE NEGATIVE mg/dL   Hgb urine dipstick NEGATIVE NEGATIVE   Bilirubin Urine NEGATIVE NEGATIVE   Ketones, ur NEGATIVE NEGATIVE mg/dL   Protein, ur NEGATIVE NEGATIVE mg/dL   Nitrite NEGATIVE NEGATIVE   Leukocytes,Ua NEGATIVE NEGATIVE   Imaging Studies: No results found.  ED COURSE and MDM  Nursing notes, initial and subsequent vitals signs, including pulse oximetry, reviewed and interpreted by myself.  Vitals:   10/24/19 0149 10/24/19 0430  BP: (!) 141/83 (!) 146/68  Pulse: (!) 112 78  Resp: 16 16  Temp: 98.3 F (36.8 C) 98.4 F (36.9 C)  TempSrc: Oral Oral  SpO2: 96% 98%   Medications  sodium chloride 0.9 % bolus 1,000 mL ( Intravenous Stopped 10/24/19 0323)  ketorolac (TORADOL) 15 MG/ML injection 15 mg (15 mg Intravenous Given 10/24/19 0219)   Buryl Keysor was evaluated in Emergency Department on 10/24/2019 for the symptoms described in the history of present illness. He was evaluated in the context of the global COVID-19 pandemic, which necessitated consideration that the patient might be at risk for infection with the SARS-CoV-2 virus that causes COVID-19. Institutional protocols and algorithms that pertain to the evaluation of patients at risk for COVID-19 are in a state of rapid change based on information released by regulatory bodies including the CDC and federal and state organizations. These policies and algorithms were followed during the patient's care in the ED.  Patient symptoms are concerning for COVID-19 as some patients have only body aches as a major symptom.  We will have him self isolate  and treat with NSAIDs for body aches and fever.  He should return for difficulty breathing.  PROCEDURES  Procedures   ED DIAGNOSES     ICD-10-CM   1. Suspected COVID-19 virus infection  Z20.828        Breshae Belcher, Jonny Ruiz, MD 10/24/19 [phone]

## 2020-01-27 ENCOUNTER — Other Ambulatory Visit: Payer: Self-pay

## 2020-01-27 ENCOUNTER — Emergency Department (HOSPITAL_BASED_OUTPATIENT_CLINIC_OR_DEPARTMENT_OTHER)
Admission: EM | Admit: 2020-01-27 | Discharge: 2020-01-27 | Disposition: A | Discharge status: Home / Self Care | Payer: Medicaid Other | Attending: Emergency Medicine | Admitting: Emergency Medicine

## 2020-01-27 ENCOUNTER — Encounter (HOSPITAL_BASED_OUTPATIENT_CLINIC_OR_DEPARTMENT_OTHER): Payer: Self-pay

## 2020-01-27 DIAGNOSIS — R0602 Shortness of breath: Secondary | ICD-10-CM | POA: Insufficient documentation

## 2020-01-27 DIAGNOSIS — J40 Bronchitis, not specified as acute or chronic: Secondary | ICD-10-CM | POA: Insufficient documentation

## 2020-01-27 DIAGNOSIS — R0981 Nasal congestion: Secondary | ICD-10-CM | POA: Diagnosis present

## 2020-01-27 DIAGNOSIS — Z20822 Contact with and (suspected) exposure to covid-19: Secondary | ICD-10-CM | POA: Insufficient documentation

## 2020-01-27 DIAGNOSIS — Z79899 Other long term (current) drug therapy: Secondary | ICD-10-CM | POA: Diagnosis not present

## 2020-01-27 DIAGNOSIS — F17210 Nicotine dependence, cigarettes, uncomplicated: Secondary | ICD-10-CM | POA: Insufficient documentation

## 2020-01-27 MED ORDER — ALBUTEROL SULFATE HFA 108 (90 BASE) MCG/ACT IN AERS
2.0000 | INHALATION_SPRAY | Freq: Once | RESPIRATORY_TRACT | Status: AC
  Administered 2020-01-27: 2 via RESPIRATORY_TRACT
  Filled 2020-01-27: qty 6.7

## 2020-01-27 NOTE — ED Provider Notes (Signed)
[facility] EMERGENCY DEPARTMENT Provider Note   CSN: [id number] Arrival date & time: 01/27/20  1018     History Chief Complaint  Patient presents with  . Nasal Congestion    Ronald Hawkins is a 44 y.o. male who presents with runny nose, shortness of breath, cough.  Patient states for the past 3 days he has had multiple URI symptoms including a fever, sore throat, intermittent runny nose.  He is also had cough and shortness of breath.  He denies headache, inability to smell or taste, chest pain, abdominal pain, nausea, vomiting, diarrhea.  He is here with his 2 young children who have similar symptoms.  He states everyone in the household has had symptoms like this.  He has been tested for Covid in the past but not recently.  He is a smoker.  He states that he had a fever couple days ago but has not had recurrent fever.  HPI     History reviewed. No pertinent past medical history.  There are no problems to display for this patient.   History reviewed. No pertinent surgical history.     No family history on file.  Social History   Tobacco Use  . Smoking status: Current Every Day Smoker  . Smokeless tobacco: Never Used  Substance Use Topics  . Alcohol use: No  . Drug use: Never    Home Medications Prior to Admission medications   Medication Sig Start Date End Date Taking? Authorizing Provider  atorvastatin (LIPITOR) 40 MG tablet Take 40 mg by mouth at bedtime. 09/26/19   [provider]  naproxen (NAPROSYN) 500 MG tablet Take 1 tablet twice daily as needed for fever or body aches. 10/24/19   Molpus, John, MD    Allergies    Other  Review of Systems   Review of Systems  Constitutional: Positive for fatigue and fever.  HENT: Positive for rhinorrhea and sore throat. Negative for ear pain.   Respiratory: Positive for cough, shortness of breath and wheezing.   Cardiovascular: Negative for chest pain.  Gastrointestinal: Negative for abdominal pain,  diarrhea, nausea and vomiting.    Physical Exam Updated Vital Signs BP 121/84 (BP Location: Left Arm)   Pulse 79   Temp 98.4 F (36.9 C) (Oral)   Resp 18   Ht 5\' 5"  (1.651 m)   Wt 91.4 kg   SpO2 99%   BMI 33.53 kg/m   Physical Exam Vitals and nursing note reviewed.  Constitutional:      General: He is not in acute distress.    Appearance: Normal appearance. He is well-developed. He is not ill-appearing.  HENT:     Head: Normocephalic and atraumatic.     Right Ear: Tympanic membrane normal.     Left Ear: Tympanic membrane normal.     Nose: Nose normal.     Mouth/Throat:     Mouth: Mucous membranes are moist.  Eyes:     General: No scleral icterus.       Right eye: No discharge.        Left eye: No discharge.     Conjunctiva/sclera: Conjunctivae normal.     Pupils: Pupils are equal, round, and reactive to light.  Cardiovascular:     Rate and Rhythm: Normal rate and regular rhythm.  Pulmonary:     Effort: Pulmonary effort is normal. No respiratory distress.     Breath sounds: Wheezing (Diffuse expiratory wheezes) present.  Abdominal:     General: There is no distension.  Musculoskeletal:     Cervical back: Normal range of motion.  Skin:    General: Skin is warm and dry.  Neurological:     Mental Status: He is alert and oriented to person, place, and time.  Psychiatric:        Behavior: Behavior normal.     ED Results / Procedures / Treatments   Labs (all labs ordered are listed, but only abnormal results are displayed) Labs Reviewed  NOVEL CORONAVIRUS, NAA (HOSP ORDER, SEND-OUT TO REF LAB; TAT 18-24 HRS)    EKG None  Radiology No results found.  Procedures Procedures (including critical care time)  Medications Ordered in ED Medications  albuterol (VENTOLIN HFA) 108 (90 Base) MCG/ACT inhaler 2 puff (2 puffs Inhalation Given 01/27/20 1237)    ED Course  I have reviewed the triage vital signs and the nursing notes.  Pertinent labs & imaging results  that were available during my care of the patient were reviewed by me and considered in my medical decision making (see chart for details).  43 year old male presents with symptoms consistent with viral bronchitis.  His vital signs are normal here.  HEENT exam is unremarkable.  On his lung exam he is wheezing.  He is a smoker.  We will treat for bronchitis with an inhaler.  Do not feel he needs imaging or prednisone today since wheezing is minimal and vitals are reassuring.  He is requesting Covid testing as well which was sent out.  [name] was evaluated in Emergency Department on 01/27/2020 for the symptoms described in the history of present illness. He was evaluated in the context of the global COVID-19 pandemic, which necessitated consideration that the patient might be at risk for infection with the SARS-CoV-2 virus that causes COVID-19. Institutional protocols and algorithms that pertain to the evaluation of patients at risk for COVID-19 are in a state of rapid change based on information released by regulatory bodies including the CDC and federal and state organizations. These policies and algorithms were followed during the patient's care in the ED.   MDM Rules/Calculators/A&P                       Final Clinical Impression(s) / ED Diagnoses Final diagnoses:  Bronchitis    Rx / DC Orders ED Discharge Orders    None       Bethel Born, PA-C 01/27/20 1338    Little, Ambrose Finland, MD 01/27/20 [phone]

## 2020-01-27 NOTE — ED Triage Notes (Signed)
Pt arrives ambulatory to ED with reports of cold symptoms X5 days - some SOB, sore throat, tiredness, cough, and runny nose. Pt denies any loss of taste or smell, diarrhea,

## 2020-01-27 NOTE — Discharge Instructions (Signed)
Use inhaler as needed for shortness of breath and wheezing and avoid smoking Please quarantine until you get the results of your COVID test which should take 2-3 days Take Tylenol or Ibuprofen for fever or pain as needed Please follow up with your doctor

## 2020-01-29 LAB — NOVEL CORONAVIRUS, NAA (HOSP ORDER, SEND-OUT TO REF LAB; TAT 18-24 HRS): SARS-CoV-2, NAA: NOT DETECTED

## 2020-03-17 ENCOUNTER — Emergency Department (HOSPITAL_BASED_OUTPATIENT_CLINIC_OR_DEPARTMENT_OTHER)
Admission: EM | Admit: 2020-03-17 | Discharge: 2020-03-17 | Disposition: A | Discharge status: Home / Self Care | Payer: Medicaid Other | Attending: Emergency Medicine | Admitting: Emergency Medicine

## 2020-03-17 ENCOUNTER — Emergency Department (HOSPITAL_BASED_OUTPATIENT_CLINIC_OR_DEPARTMENT_OTHER): Payer: Medicaid Other

## 2020-03-17 ENCOUNTER — Other Ambulatory Visit: Payer: Self-pay

## 2020-03-17 ENCOUNTER — Encounter (HOSPITAL_BASED_OUTPATIENT_CLINIC_OR_DEPARTMENT_OTHER): Payer: Self-pay | Admitting: Emergency Medicine

## 2020-03-17 DIAGNOSIS — F17210 Nicotine dependence, cigarettes, uncomplicated: Secondary | ICD-10-CM | POA: Diagnosis not present

## 2020-03-17 DIAGNOSIS — R062 Wheezing: Secondary | ICD-10-CM | POA: Diagnosis not present

## 2020-03-17 DIAGNOSIS — Z20822 Contact with and (suspected) exposure to covid-19: Secondary | ICD-10-CM | POA: Diagnosis not present

## 2020-03-17 DIAGNOSIS — R05 Cough: Secondary | ICD-10-CM | POA: Insufficient documentation

## 2020-03-17 DIAGNOSIS — M7918 Myalgia, other site: Secondary | ICD-10-CM | POA: Diagnosis not present

## 2020-03-17 MED ORDER — ALBUTEROL SULFATE HFA 108 (90 BASE) MCG/ACT IN AERS
2.0000 | INHALATION_SPRAY | Freq: Once | RESPIRATORY_TRACT | Status: AC
  Administered 2020-03-17: 2 via RESPIRATORY_TRACT
  Filled 2020-03-17: qty 6.7

## 2020-03-17 NOTE — ED Triage Notes (Signed)
Triaged with interpreter for Arabic. PT c/o body aches, cough and near friend yesterday who had covid.

## 2020-03-17 NOTE — ED Provider Notes (Signed)
Emergency Department Provider Note   I have reviewed the triage vital signs and the nursing notes.   HISTORY  Chief Complaint Cough and Generalized Body Aches  Encounter performed entirely with arabic video interpreter.  HPI Ronald Hawkins is a 44 y.o. male presents to the ED with cough, wheezing, and body aches in the setting of recent COVID exposure. He has had close contact with an individual who has tested positive for COVID recently. He denies CP or SOB. No fever. No chills. No diarrhea or vomiting. Patient does smoke cigarettes. No radiation of symptoms or modifying factors.   History reviewed. No pertinent past medical history.  There are no problems to display for this patient.   History reviewed. No pertinent surgical history.  Allergies Other  No family history on file.  Social History Social History   Tobacco Use  . Smoking status: Current Every Day Smoker  . Smokeless tobacco: Never Used  Substance Use Topics  . Alcohol use: No  . Drug use: Never    Review of Systems  Constitutional: No fever/chills. Positive body aches.  Eyes: No visual changes. ENT: No sore throat. Cardiovascular: Denies chest pain. Respiratory: Denies shortness of breath. Positive cough.  Gastrointestinal: No abdominal pain.  No nausea, no vomiting.  No diarrhea.  No constipation. Genitourinary: Negative for dysuria. Musculoskeletal: Negative for back pain. Skin: Negative for rash. Neurological: Negative for headaches, focal weakness or numbness.  10-point ROS otherwise negative.  ____________________________________________   PHYSICAL EXAM:  VITAL SIGNS: ED Triage Vitals  Enc Vitals Group     BP 03/17/20 2217 130/90     Pulse Rate 03/17/20 2217 78     Resp 03/17/20 2217 18     Temp 03/17/20 2217 98 F (36.7 C)     Temp Source 03/17/20 2217 Oral     SpO2 03/17/20 2217 99 %     Weight 03/17/20 2217 197 lb (89.4 kg)     Height 03/17/20 2217 5\' 7"  (1.702 m)    Constitutional: Alert and oriented. Well appearing and in no acute distress. Eyes: Conjunctivae are normal. Head: Atraumatic. Nose: Mild congestion/rhinnorhea. Mouth/Throat: Mucous membranes are moist.  Oropharynx non-erythematous. Neck: No stridor.   Cardiovascular: Normal rate, regular rhythm. Good peripheral circulation. Grossly normal heart sounds.   Respiratory: Normal respiratory effort.  No retractions. Lungs with faint wheezing. Good air movement.  Gastrointestinal: Soft and nontender. No distention.  Musculoskeletal: No gross deformities of extremities. Neurologic:  Normal speech and language.  Skin:  Skin is warm, dry and intact. No rash noted.  ____________________________________________   LABS (all labs ordered are listed, but only abnormal results are displayed)  Labs Reviewed  SARS CORONAVIRUS 2 (TAT 6-24 HRS)   ____________________________________________  RADIOLOGY  DG Chest Portable 1 View  Result Date: 03/17/2020 CLINICAL DATA:  Cough EXAM: PORTABLE CHEST 1 VIEW COMPARISON:  11/04/2019 FINDINGS: Linear atelectasis or scar at the left base. No consolidation or effusion. Normal heart size. No pneumothorax IMPRESSION: No active disease.  Scarring or atelectasis at the left base Electronically Signed   By: Donavan Foil M.D.   On: 03/17/2020 23:13    ____________________________________________   PROCEDURES  Procedure(s) performed:   Procedures  None ____________________________________________   INITIAL IMPRESSION / ASSESSMENT AND PLAN / ED COURSE  Pertinent labs & imaging results that were available during my care of the patient were reviewed by me and considered in my medical decision making (see chart for details).   Patient presents to the ED with  COVID-like symptoms. No increased WOB. Mild wheezing with smoking history. No hypoxemia. CXR reviewed and clear. Plan for COVID testing and quarantine until test is back. Discussed ED return  precautions. Discussed following results in the MyChart app.    ____________________________________________  FINAL CLINICAL IMPRESSION(S) / ED DIAGNOSES  Final diagnoses:  Suspected COVID-19 virus infection     MEDICATIONS GIVEN DURING THIS VISIT:  Medications  albuterol (VENTOLIN HFA) 108 (90 Base) MCG/ACT inhaler 2 puff (2 puffs Inhalation Given 03/17/20 2336)    Note:  This document was prepared using Dragon voice recognition software and may include unintentional dictation errors.  Alona Bene, MD, [facility]    Aviah Sorci, Arlyss Repress, MD 03/18/20 [phone]

## 2020-03-17 NOTE — Discharge Instructions (Signed)
You were seen in the emergency department today with COVID-19-like symptoms.  I am testing you for Covid and you can follow the test results in MyChart.  Please remain isolated from others for at least the next 10 days.  Return to the emergency department with any new or worsening symptoms such as chest pain, trouble breathing, confusion.

## 2020-03-18 LAB — SARS CORONAVIRUS 2 (TAT 6-24 HRS): SARS Coronavirus 2: NEGATIVE

## 2020-03-26 ENCOUNTER — Encounter (HOSPITAL_BASED_OUTPATIENT_CLINIC_OR_DEPARTMENT_OTHER): Payer: Self-pay | Admitting: *Deleted

## 2020-03-26 ENCOUNTER — Emergency Department (HOSPITAL_BASED_OUTPATIENT_CLINIC_OR_DEPARTMENT_OTHER)
Admission: EM | Admit: 2020-03-26 | Discharge: 2020-03-26 | Disposition: A | Discharge status: Home / Self Care | Payer: Medicaid Other | Attending: Emergency Medicine | Admitting: Emergency Medicine

## 2020-03-26 ENCOUNTER — Emergency Department (HOSPITAL_BASED_OUTPATIENT_CLINIC_OR_DEPARTMENT_OTHER): Payer: Medicaid Other

## 2020-03-26 ENCOUNTER — Other Ambulatory Visit: Payer: Self-pay

## 2020-03-26 DIAGNOSIS — U071 COVID-19: Secondary | ICD-10-CM | POA: Diagnosis not present

## 2020-03-26 DIAGNOSIS — R05 Cough: Secondary | ICD-10-CM

## 2020-03-26 DIAGNOSIS — F17200 Nicotine dependence, unspecified, uncomplicated: Secondary | ICD-10-CM | POA: Diagnosis not present

## 2020-03-26 DIAGNOSIS — Z79899 Other long term (current) drug therapy: Secondary | ICD-10-CM | POA: Insufficient documentation

## 2020-03-26 DIAGNOSIS — B349 Viral infection, unspecified: Secondary | ICD-10-CM

## 2020-03-26 DIAGNOSIS — Z20822 Contact with and (suspected) exposure to covid-19: Secondary | ICD-10-CM

## 2020-03-26 DIAGNOSIS — R059 Cough, unspecified: Secondary | ICD-10-CM

## 2020-03-26 LAB — SARS CORONAVIRUS 2 AG (30 MIN TAT): SARS Coronavirus 2 Ag: NEGATIVE

## 2020-03-26 LAB — SARS CORONAVIRUS 2 (TAT 6-24 HRS): SARS Coronavirus 2: POSITIVE — AB

## 2020-03-26 MED ORDER — BENZONATATE 100 MG PO CAPS: 100.0000 mg | ORAL_CAPSULE | Freq: Three times a day (TID) | ORAL | 0 refills | Status: AC

## 2020-03-26 MED FILL — BENZONATATE 100 MG CAPS: 100 | 7 days supply | Qty: 21 | Fill #0

## 2020-03-26 NOTE — ED Provider Notes (Signed)
[facility] EMERGENCY DEPARTMENT Provider Note   CSN: [id number] Arrival date & time: 03/26/20  1500     History Chief Complaint  Patient presents with  . Cough    Ronald Hawkins is a 44 y.o. male who presents to the ED today with complaints of gradual onset, constant, dry cough x 2 days. Pt also endorses shortness of breath, headache, subjective fevers, chills, loss of taste and smell x 2 days. Pt endorses that his son has been diagnosed with COVID 19.  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  He states that he is concerned that he could also have Covid given his new symptoms he would like to be checked. Pt was seen in the ED on 03/30 after COVID exposure however tested negative for COVID at that time. He was prescribed an albuterol inhaler which he has been using with mild relief. Pt denies chest pain, hemoptysis, abdominal pain, diarrhea, or any other associated symptoms.   The history is provided by the patient and medical records. The history is limited by a language barrier. A language interpreter was used.       History reviewed. No pertinent past medical history.  There are no problems to display for this patient.   History reviewed. No pertinent surgical history.     No family history on file.  Social History   Tobacco Use  . Smoking status: Current Every Day Smoker  . Smokeless tobacco: Never Used  Substance Use Topics  . Alcohol use: No  . Drug use: Never    Home Medications Prior to Admission medications   Medication Sig Start Date End Date Taking? Authorizing Provider  atorvastatin (LIPITOR) 40 MG tablet Take 40 mg by mouth at bedtime. 09/26/19   [provider]  benzonatate (TESSALON) 100 MG capsule Take 1 capsule (100 mg total) by mouth every 8 (eight) hours. 03/26/20   Hyman Hopes, Teige Rountree,  PA-C  naproxen (NAPROSYN) 500 MG tablet Take 1 tablet twice daily as needed for fever or body aches. 10/24/19   Molpus, John, MD    Allergies    Other  Review of Systems   Review of Systems  Constitutional: Positive for chills, fatigue and fever.  Respiratory: Positive for cough and shortness of breath.   Cardiovascular: Negative for chest pain.  Gastrointestinal: Negative for diarrhea.  Musculoskeletal: Positive for myalgias.    Physical Exam Updated Vital Signs BP (!) 141/97   Pulse 97   Resp 20   Ht 5\' 7"  (1.702 m)   Wt 89 kg   SpO2 98%   BMI 30.73 kg/m   Physical Exam Vitals and nursing note reviewed.  Constitutional:      Appearance: He is not ill-appearing or diaphoretic.  HENT:     Head: Normocephalic and atraumatic.  Eyes:     Conjunctiva/sclera: Conjunctivae normal.  Cardiovascular:     Rate and Rhythm: Normal rate and regular rhythm.     Pulses: Normal pulses.  Pulmonary:     Effort: Pulmonary effort is normal.     Breath sounds: Normal breath sounds. No wheezing, rhonchi or rales.  Skin:    General: Skin is warm and dry.     Coloration: Skin is not jaundiced.  Neurological:     Mental Status: He is alert.     ED Results /  Procedures / Treatments   Labs (all labs ordered are listed, but only abnormal results are displayed) Labs Reviewed  SARS CORONAVIRUS 2 (TAT 6-24 HRS) - Abnormal; Notable for the following components:      Result Value   SARS Coronavirus 2 POSITIVE (*)    All other components within normal limits  SARS CORONAVIRUS 2 AG (30 MIN TAT)    EKG EKG Interpretation  Date/Time:  Thursday March 26 2020 15:50:43 EDT Ventricular Rate:  79 PR Interval:    QRS Duration: 113 QT Interval:  373 QTC Calculation: 428 R Axis:   88 Text Interpretation: Sinus rhythm Borderline intraventricular conduction delay No prior ecg for comparison Confirmed by Veryl Speak [id number] on 03/26/2020 4:17:30 PM   Radiology DG Chest Port 1 View  Result  Date: 03/26/2020 CLINICAL DATA:  Cough and shortness of breath for COVID-19 exposure and symptomatology EXAM: PORTABLE CHEST 1 VIEW COMPARISON:  03/17/2020 FINDINGS: Cardiac shadow is within normal limits. The lungs are well aerated bilaterally. No focal infiltrate or sizable effusion is seen. No bony abnormality is noted. IMPRESSION: No active disease. Electronically Signed   By: Inez Catalina M.D.   On: 03/26/2020 16:32    Procedures Procedures (including critical care time)  Medications Ordered in ED Medications - No data to display  ED Course  I have reviewed the triage vital signs and the nursing notes.  Pertinent labs & imaging results that were available during my care of the patient were reviewed by me and considered in my medical decision making (see chart for details).  Clinical Course as of Mar 26 2336  Thu Mar 26, 2020  1613 amb to room from triage room air  0 2 sat 97%   [MV]  1623 SARS Coronavirus 2 Ag: NEGATIVE [MV]    Clinical Course User Index [MV] Eustaquio Maize, PA-C   MDM Rules/Calculators/A&P                      44 year old Arabic speaking male who presents to the ED today complaining of cough, body aches, fever, loss of taste and smell x2 days.  He has been taking care of his son at home who is Covid positive.  Patient is requesting to be tested as his other children and wife have been quarantining in a separate part of the house and he wants to make sure that he could not likely spread it to them. On arrival to the ED he is nontachycardic and nontachypneic. He does endorse SOB however able to speak in full sentences without difficulty. Satting 98% on RA. He was recently seen in the ED 1 week ago after COVID exposure however tested negative - he did not start having symptoms until 2 days ago. Will reswab. Discussed rapid test vs send out; pt is eager to know right away and therefore POC test has been ordered with plan for PCR if negative. Will obtain CXR, EKG given SOB  and have pt ambulate in room with pulse ox to ensure he does not desaturate.   Pt ambulated with pulse ox > 97% on RA.  CXR negative.  POC COVID negative; will order PCR test.   Pt discharged home at this time with instructions to self isolate until he receives his results. Have discussed that even if he is negative this may be a false negative given he has been in close contact with his son who is positive and is having covid like symptoms. Will discharge with tessalon perles  for symptomatic relief. Strict return precautions discussed. Pt in agreement with plan and stable for discharge home.   This note was prepared using Dragon voice recognition software and may include unintentional dictation errors due to the inherent limitations of voice recognition software.  Jaloni Zimny was evaluated in Emergency Department on 03/26/2020 for the symptoms described in the history of present illness. He was evaluated in the context of the global COVID-19 pandemic, which necessitated consideration that the patient might be at risk for infection with the SARS-CoV-2 virus that causes COVID-19. Institutional protocols and algorithms that pertain to the evaluation of patients at risk for COVID-19 are in a state of rapid change based on information released by regulatory bodies including the CDC and federal and state organizations. These policies and algorithms were followed during the patient's care in the ED.  Final Clinical Impression(s) / ED Diagnoses Final diagnoses:  Cough  Viral illness  Person under investigation for COVID-19    Rx / DC Orders ED Discharge Orders         Ordered    benzonatate (TESSALON) 100 MG capsule  Every 8 hours     03/26/20 1636           Discharge Instructions     Please stay at home and self isolate until you hear about your test results. We will call you in 2-3 days time IF you test positive. You can also log into your mychart to check the results that way. Given you  have been around your son who is positive it is very likely that you also have COVID 19 today.   Continue using your albuterol inhaler as needed. I have prescribed cough medication to take as well. Continue monitoring your fevers and take Tylenol as needed if your fever is greater than 100.4 F  Follow up with your PCP. If you do not have one you can follow up with [facility] for your primary care needs  Return to the ED IMMEDIATELY for any worsening symptoms including worsening shortness of breath, rapid breathing, chest pain.        Tanda Rockers, PA-C 03/26/20 0160    Geoffery Lyons, MD 03/26/20 [phone]

## 2020-03-26 NOTE — ED Triage Notes (Signed)
Pt c/o cough , loss of smell and taste and fever x 3 days, son at home with covid

## 2020-03-26 NOTE — Discharge Instructions (Signed)
Please stay at home and self isolate until you hear about your test results. We will call you in 2-3 days time IF you test positive. You can also log into your mychart to check the results that way. Given you have been around your son who is positive it is very likely that you also have COVID 19 today.   Continue using your albuterol inhaler as needed. I have prescribed cough medication to take as well. Continue monitoring your fevers and take Tylenol as needed if your fever is greater than 100.4 F  Follow up with your PCP. If you do not have one you can follow up with [facility] and [facility] for your primary care needs  Return to the ED IMMEDIATELY for any worsening symptoms including worsening shortness of breath, rapid breathing, chest pain.

## 2020-03-26 NOTE — ED Notes (Signed)
amb to room from triage room air  0 2 sat 97%

## 2020-04-02 ENCOUNTER — Other Ambulatory Visit: Payer: Self-pay

## 2020-04-02 ENCOUNTER — Encounter (HOSPITAL_BASED_OUTPATIENT_CLINIC_OR_DEPARTMENT_OTHER): Payer: Self-pay | Admitting: *Deleted

## 2020-04-02 ENCOUNTER — Emergency Department (HOSPITAL_BASED_OUTPATIENT_CLINIC_OR_DEPARTMENT_OTHER)
Admission: EM | Admit: 2020-04-02 | Discharge: 2020-04-03 | Disposition: A | Discharge status: Home / Self Care | Payer: Medicaid Other | Attending: Emergency Medicine | Admitting: Emergency Medicine

## 2020-04-02 DIAGNOSIS — M7918 Myalgia, other site: Secondary | ICD-10-CM | POA: Insufficient documentation

## 2020-04-02 DIAGNOSIS — U071 COVID-19: Secondary | ICD-10-CM | POA: Diagnosis not present

## 2020-04-02 DIAGNOSIS — R509 Fever, unspecified: Secondary | ICD-10-CM | POA: Insufficient documentation

## 2020-04-02 DIAGNOSIS — F17200 Nicotine dependence, unspecified, uncomplicated: Secondary | ICD-10-CM | POA: Diagnosis not present

## 2020-04-02 NOTE — ED Provider Notes (Addendum)
[facility] Provider Note   CSN: [id number] Arrival date & time: 04/02/20  2051     History Chief Complaint  Patient presents with  . Covid Positive    Ronald Hawkins is a 44 y.o. male.  Patient is a 44 year old male with no significant past medical history.  He presents today for evaluation of body aches.  He had a positive Covid test 1 week ago.  He denies any shortness of breath, chest pain.  He does report fevers and chills.  He is here with 2 family members who are ill in a similar fashion.  The history is provided by the patient.       History reviewed. No pertinent past medical history.  There are no problems to display for this patient.   History reviewed. No pertinent surgical history.     No family history on file.  Social History   Tobacco Use  . Smoking status: Current Every Day Smoker  . Smokeless tobacco: Never Used  Substance Use Topics  . Alcohol use: No  . Drug use: Never    Home Medications Prior to Admission medications   Medication Sig Start Date End Date Taking? Authorizing Provider  atorvastatin (LIPITOR) 40 MG tablet Take 40 mg by mouth at bedtime. 09/26/19   [provider]  benzonatate (TESSALON) 100 MG capsule Take 1 capsule (100 mg total) by mouth every 8 (eight) hours. 03/26/20   Alroy Bailiff, Margaux, PA-C  naproxen (NAPROSYN) 500 MG tablet Take 1 tablet twice daily as needed for fever or body aches. 10/24/19   Molpus, Jenny Reichmann, MD    Allergies    Other  Review of Systems   Review of Systems  All other systems reviewed and are negative.   Physical Exam Updated Vital Signs BP 122/89   Pulse 97   Temp 98.1 F (36.7 C) (Oral)   Resp 20   Ht 5\' 7"  (1.702 m)   Wt 89 kg   SpO2 98%   BMI 30.73 kg/m   Physical Exam Vitals and nursing note reviewed.  Constitutional:      General: He is not in acute distress.    Appearance: He is well-developed. He is not diaphoretic.  HENT:     Head:  Normocephalic and atraumatic.  Cardiovascular:     Rate and Rhythm: Normal rate and regular rhythm.     Heart sounds: No murmur. No friction rub.  Pulmonary:     Effort: Pulmonary effort is normal. No respiratory distress.     Breath sounds: Normal breath sounds. No wheezing or rales.  Abdominal:     General: Bowel sounds are normal. There is no distension.     Palpations: Abdomen is soft.     Tenderness: There is no abdominal tenderness.  Musculoskeletal:        General: Normal range of motion.     Cervical back: Normal range of motion and neck supple.  Skin:    General: Skin is warm and dry.  Neurological:     Mental Status: He is alert and oriented to person, place, and time.     Coordination: Coordination normal.     ED Results / Procedures / Treatments   Labs (all labs ordered are listed, but only abnormal results are displayed) Labs Reviewed - No data to display  EKG None  Radiology No results found.  Procedures Procedures (including critical care time)  Medications Ordered in ED Medications - No data to display  ED Course  I have reviewed the triage vital signs and the nursing notes.  Pertinent labs & imaging results that were available during my care of the patient were reviewed by me and considered in my medical decision making (see chart for details).    MDM Rules/Calculators/A&P  Patient presenting with body aches and positive Covid test 1 week ago.  Patient is not hypoxic and vital signs are stable.  I see no indication for admission.  He is otherwise healthy and is well-appearing.  Patient to be discharged with increased fluid intake, Tylenol, and ibuprofen as needed.  Deloyd Ballard was evaluated in Emergency Department on 04/02/2020 for the symptoms described in the history of present illness. He was evaluated in the context of the global COVID-19 pandemic, which necessitated consideration that the patient might be at risk for infection with the  SARS-CoV-2 virus that causes COVID-19. Institutional protocols and algorithms that pertain to the evaluation of patients at risk for COVID-19 are in a state of rapid change based on information released by regulatory bodies including the CDC and federal and state organizations. These policies and algorithms were followed during the patient's care in the ED.  Final Clinical Impression(s) / ED Diagnoses Final diagnoses:  None    Rx / DC Orders ED Discharge Orders    None       Geoffery Lyons, MD 04/02/20 1740    Geoffery Lyons, MD 04/02/20 [phone]

## 2020-04-02 NOTE — Discharge Instructions (Addendum)
Drink plenty of fluids and get plenty of rest.  Tylenol 1000 mg rotated with ibuprofen 600 mg every 4 hours as needed for pain or fever.  Return to the emergency department if you develop difficulty breathing, severe chest pain, or other new and concerning symptoms.  Isolate at home until your symptoms are gone +1-week.      Person Under Monitoring Name: Ronald Hawkins  Location: [address] [place] [address]   Infection Prevention Recommendations for Individuals Confirmed to have, or Being Evaluated for, 2019 Novel Coronavirus (COVID-19) Infection Who Receive Care at Home  Individuals who are confirmed to have, or are being evaluated for, COVID-19 should follow the prevention steps below until a healthcare provider or local or state health department says they can return to normal activities.  Stay home except to get medical care You should restrict activities outside your home, except for getting medical care. Do not go to work, school, or public areas, and do not use public transportation or taxis.  Call ahead before visiting your doctor Before your medical appointment, call the healthcare provider and tell them that you have, or are being evaluated for, COVID-19 infection. This will help the healthcare provider's office take steps to keep other people from getting infected. Ask your healthcare provider to call the local or state health department.  Monitor your symptoms Seek prompt medical attention if your illness is worsening (e.g., difficulty breathing). Before going to your medical appointment, call the healthcare provider and tell them that you have, or are being evaluated for, COVID-19 infection. Ask your healthcare provider to call the local or state health department.  Wear a facemask You should wear a facemask that covers your nose and mouth when you are in the same room with other people and when you visit a healthcare provider. People who live with or  visit you should also wear a facemask while they are in the same room with you.  Separate yourself from other people in your home As much as possible, you should stay in a different room from other people in your home. Also, you should use a separate bathroom, if available.  Avoid sharing household items You should not share dishes, drinking glasses, cups, eating utensils, towels, bedding, or other items with other people in your home. After using these items, you should wash them thoroughly with soap and water.  Cover your coughs and sneezes Cover your mouth and nose with a tissue when you cough or sneeze, or you can cough or sneeze into your sleeve. Throw used tissues in a lined trash can, and immediately wash your hands with soap and water for at least 20 seconds or use an alcohol-based hand rub.  Wash your [employer] your hands often and thoroughly with soap and water for at least 20 seconds. You can use an alcohol-based hand sanitizer if soap and water are not available and if your hands are not visibly dirty. Avoid touching your eyes, nose, and mouth with unwashed hands.   Prevention Steps for Caregivers and Household Members of Individuals Confirmed to have, or Being Evaluated for, COVID-19 Infection Being Cared for in the Home  If you live with, or provide care at home for, a person confirmed to have, or being evaluated for, COVID-19 infection please follow these guidelines to prevent infection:  Follow healthcare provider's instructions Make sure that you understand and can help the patient follow any healthcare provider instructions for all care.  Provide for the patient's basic  needs You should help the patient with basic needs in the home and provide support for getting groceries, prescriptions, and other personal needs.  Monitor the patient's symptoms If they are getting sicker, call his or her medical provider and tell them that the patient has, or is being evaluated  for, COVID-19 infection. This will help the healthcare provider's office take steps to keep other people from getting infected. Ask the healthcare provider to call the local or state health department.  Limit the number of people who have contact with the patient If possible, have only one caregiver for the patient. Other household members should stay in another home or place of residence. If this is not possible, they should stay in another room, or be separated from the patient as much as possible. Use a separate bathroom, if available. Restrict visitors who do not have an essential need to be in the home.  Keep older adults, very young children, and other sick people away from the patient Keep older adults, very young children, and those who have compromised immune systems or chronic health conditions away from the patient. This includes people with chronic heart, lung, or kidney conditions, diabetes, and cancer.  Ensure good ventilation Make sure that shared spaces in the home have good air flow, such as from an air conditioner or an opened window, weather permitting.  Wash your hands often Wash your hands often and thoroughly with soap and water for at least 20 seconds. You can use an alcohol based hand sanitizer if soap and water are not available and if your hands are not visibly dirty. Avoid touching your eyes, nose, and mouth with unwashed hands. Use disposable paper towels to dry your hands. If not available, use dedicated cloth towels and replace them when they become wet.  Wear a facemask and gloves Wear a disposable facemask at all times in the room and gloves when you touch or have contact with the patient's blood, body fluids, and/or secretions or excretions, such as sweat, saliva, sputum, nasal mucus, vomit, urine, or feces.  Ensure the mask fits over your nose and mouth tightly, and do not touch it during use. Throw out disposable facemasks and gloves after using them. Do not  reuse. Wash your hands immediately after removing your facemask and gloves. If your personal clothing becomes contaminated, carefully remove clothing and launder. Wash your hands after handling contaminated clothing. Place all used disposable facemasks, gloves, and other waste in a lined container before disposing them with other household waste. Remove gloves and wash your hands immediately after handling these items.  Do not share dishes, glasses, or other household items with the patient Avoid sharing household items. You should not share dishes, drinking glasses, cups, eating utensils, towels, bedding, or other items with a patient who is confirmed to have, or being evaluated for, COVID-19 infection. After the person uses these items, you should wash them thoroughly with soap and water.  Wash laundry thoroughly Immediately remove and wash clothes or bedding that have blood, body fluids, and/or secretions or excretions, such as sweat, saliva, sputum, nasal mucus, vomit, urine, or feces, on them. Wear gloves when handling laundry from the patient. Read and follow directions on labels of laundry or clothing items and detergent. In general, wash and dry with the warmest temperatures recommended on the label.  Clean all areas the individual has used often Clean all touchable surfaces, such as counters, tabletops, doorknobs, bathroom fixtures, toilets, phones, keyboards, tablets, and bedside tables, every day.  Also, clean any surfaces that may have blood, body fluids, and/or secretions or excretions on them. Wear gloves when cleaning surfaces the patient has come in contact with. Use a diluted bleach solution (e.g., dilute bleach with 1 part bleach and 10 parts water) or a household disinfectant with a label that says EPA-registered for coronaviruses. To make a bleach solution at home, add 1 tablespoon of bleach to 1 quart (4 cups) of water. For a larger supply, add  cup of bleach to 1 gallon (16  cups) of water. Read labels of cleaning products and follow recommendations provided on product labels. Labels contain instructions for safe and effective use of the cleaning product including precautions you should take when applying the product, such as wearing gloves or eye protection and making sure you have good ventilation during use of the product. Remove gloves and wash hands immediately after cleaning.  Monitor yourself for signs and symptoms of illness Caregivers and household members are considered close contacts, should monitor their health, and will be asked to limit movement outside of the home to the extent possible. Follow the monitoring steps for close contacts listed on the symptom monitoring form.   ? If you have additional questions, contact your local health department or call the epidemiologist on call at [phone] (available 24/7). ? This guidance is subject to change. For the most up-to-date guidance from [facility], please refer to their website: [email or web address]

## 2020-04-02 NOTE — ED Triage Notes (Signed)
States he was diagnosed with Covid a week ago. Here with head and abdominal pain. He has been taking Ibuprofen for pain and fever since diagnosis.

## 2021-10-01 IMAGING — MR MR LUMBAR SPINE W/O CM
4 of 5 series · 25 of 48 positions shown · non-contrast
Comparison: Lumbar spine radiograph 06/01/2019

CLINICAL DATA: Two year history of low back pain.

EXAM:
MRI LUMBAR SPINE WITHOUT CONTRAST
TECHNIQUE: Multiplanar, multisequence MR imaging of the lumbar spine was
performed. No intravenous contrast was administered.

[Series 4: T1 · sagittal · 4.0mm · 0.55mm/px · 5 of 13 slices shown (1 of 2)]
[im 1/13]
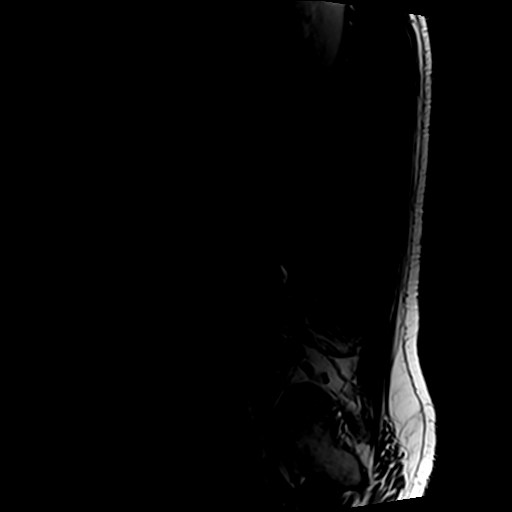
[im 4/13]
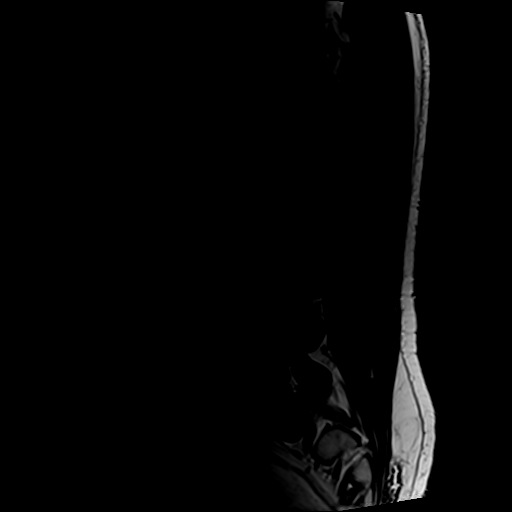
[im 7/13]
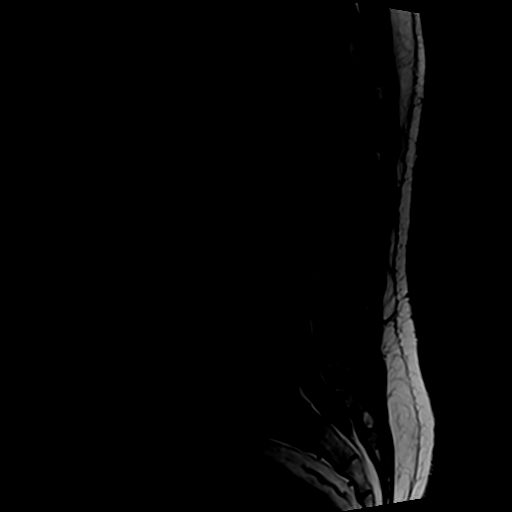
[im 10/13]
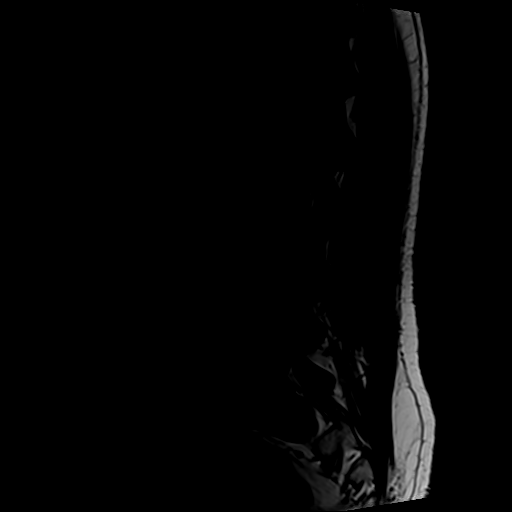
[im 13/13]
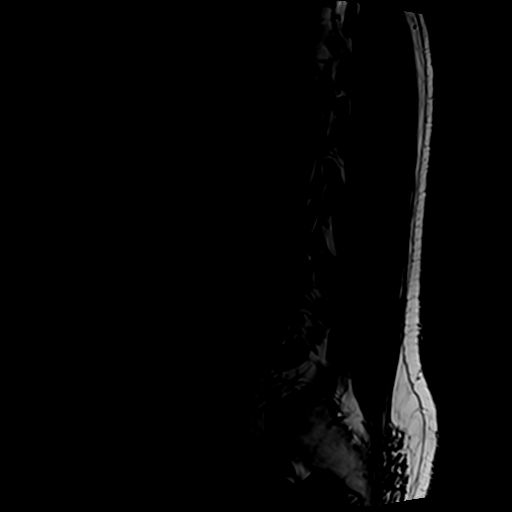

[Series 5: T2 post-contrast · sagittal · 4.0mm · 0.55mm/px · 5 of 13 slices shown]
[im 1/13]
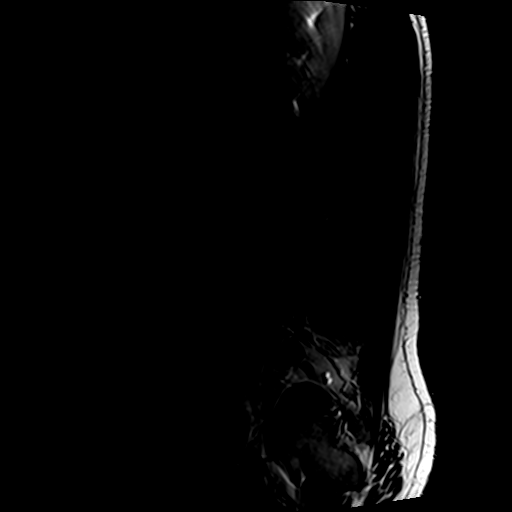
[im 4/13]
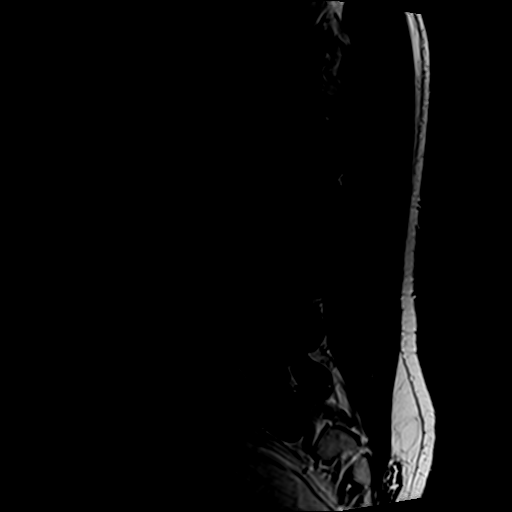
[im 7/13]
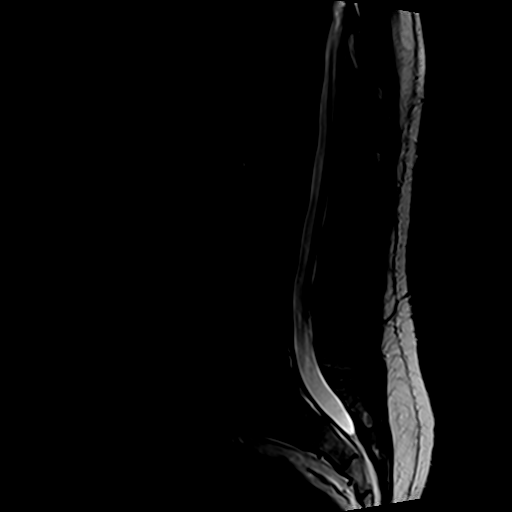
[im 10/13]
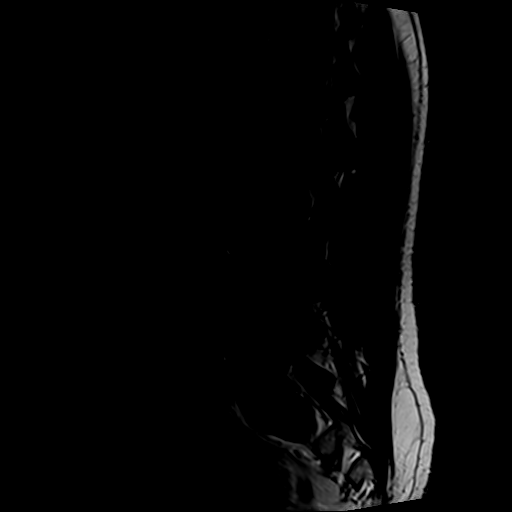
[im 13/13]
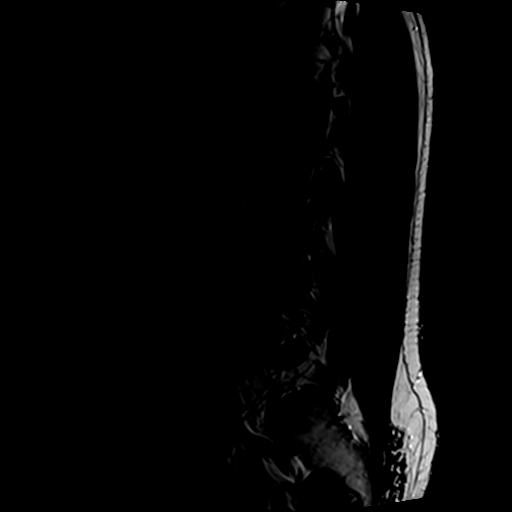

[Series 6: T2 · axial · 4.0mm · 0.70mm/px · z∈[-52,+156]mm · 10 of 40 slices shown]
[im 3/40]
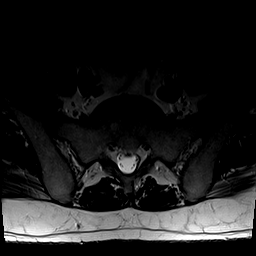
[im 6/40]
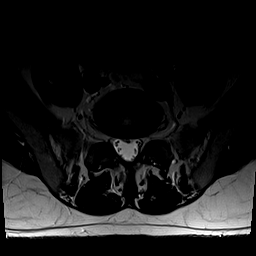
[im 8/40]
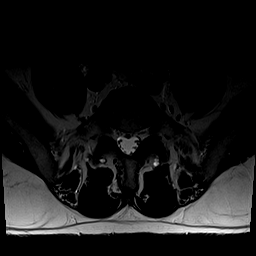
[im 14/40]
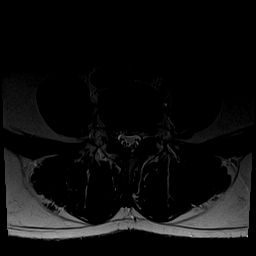
[im 19/40]
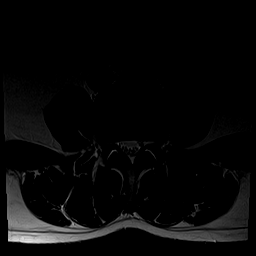
[im 21/40]
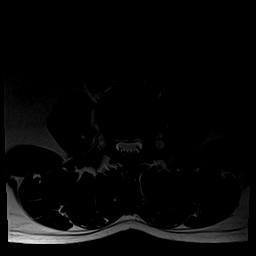
[im 24/40]
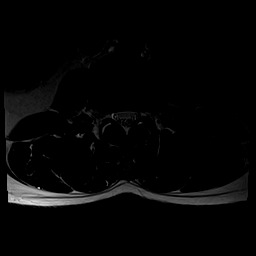
[im 29/40]
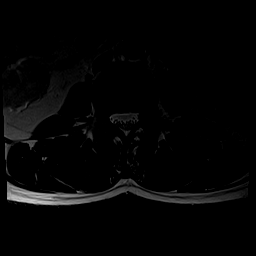
[im 34/40]
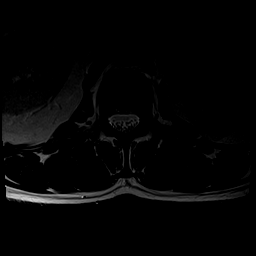
[im 40/40]
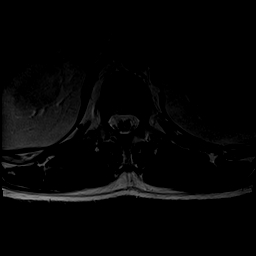

[Series 7: T1 · axial · 4.0mm · 0.35mm/px · z∈[-52,+125]mm · 5 of 40 slices shown (2 of 2)]
[im 3/40]
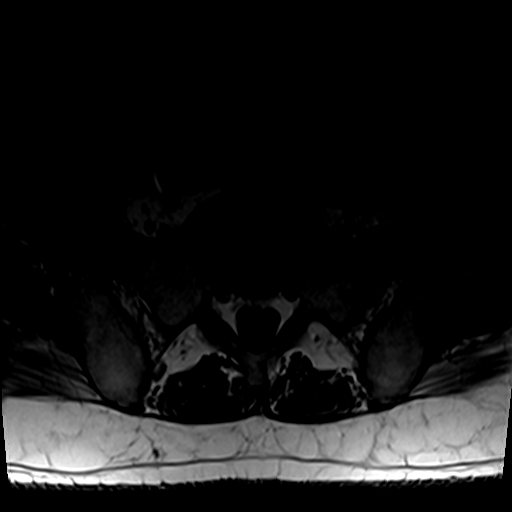
[im 6/40]
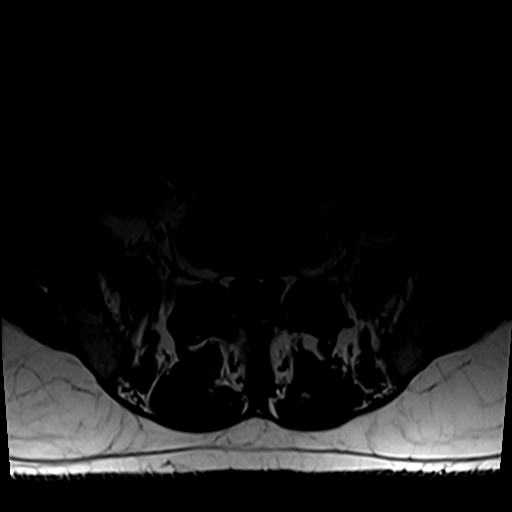
[im 8/40]
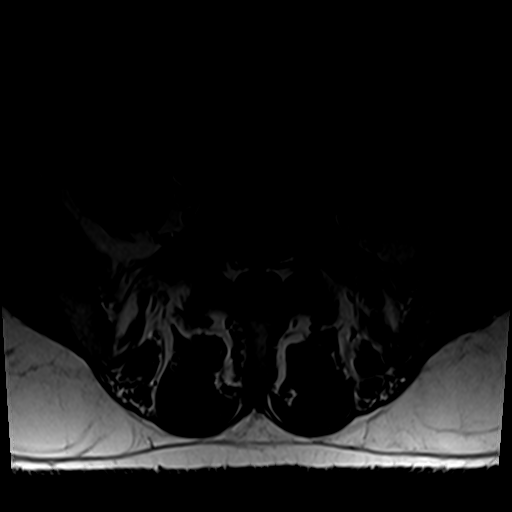
[im 21/40]
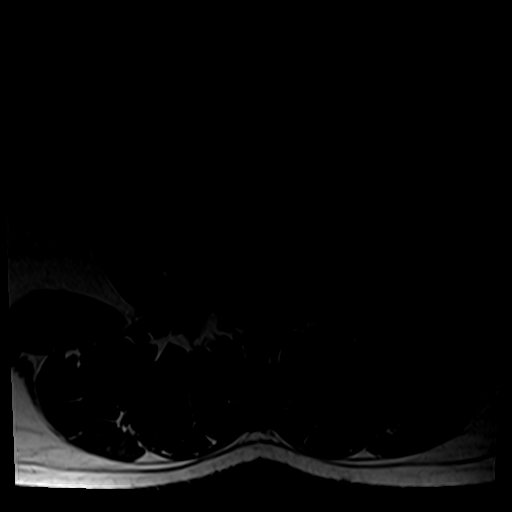
[im 34/40]
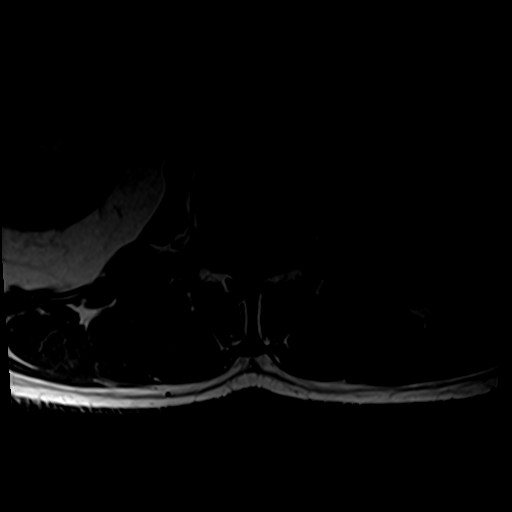

[25 of 48 positions shown; findings below may reference images not displayed]

FINDINGS: Segmentation: There are five lumbar type vertebral bodies. The last
full intervertebral disc space is labeled L5-S1. This correlates
with the lumbar radiographs.

Alignment:  Normal alignment of the lumbar vertebral bodies.

Vertebrae:  Normal marrow signal.  No bone lesions or fractures.

Conus medullaris and cauda equina: Conus extends to the T12-L1
level. Conus and cauda equina appear normal.

Paraspinal and other soft tissues: No significant paraspinal or
retroperitoneal findings. Small right renal cyst is noted.

Disc levels:

T12-L1: No significant findings.

L1-2: No significant findings.

L2-3: Mild diffuse annular bulge but no spinal or lateral recess
stenosis. There is a shallow lateral foraminal and extraforaminal
disc protrusion on the right potentially irritating the right L2
nerve root. No direct neural compression.

L3-4: Mild diffuse annular bulge but no spinal or lateral recess
stenosis. There are shallow bilateral foraminal and extraforaminal
disc protrusions without direct neural compression but potential
irritation of both L3 nerve roots.

L4-5: Bilateral foraminal annular rents and shallow right
extraforaminal disc protrusion on the right. No direct neural
compression but potential irritation of the extraforaminal right L4
nerve root. No spinal or lateral recess stenosis.

L5-S1: Mild facet disease but no disc protrusions, spinal or
foraminal stenosis.
IMPRESSION: 1. Foraminal/extraforaminal disc protrusions on the right at L2-3,
bilaterally at L3-4 and on the right at L4-5 with potential
irritation of the exiting nerve roots as detailed above.
2. No significant spinal or lateral recess stenosis.
